# Patient Record
Sex: Male | Born: 1995 | Race: Black or African American | Hispanic: No | Marital: Single | Smoking: Never smoker
Health system: Southern US, Academic
[De-identification: ages and names within clinical notes are randomized; demographics above are authoritative.]

---
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
## 2017-10-31 IMAGING — MR MR SHOULDER*L* W/ CM
4 of 6 series · 10 of 40 positions shown · IV contrast (agent unspecified)
Comparison: Injection images same date.

CLINICAL DATA: History of left shoulder dislocation with labral
repair 14 months ago. Fall ball injury last month with recurrent
shoulder subluxation.

EXAM:
MR ARTHROGRAM OF THE LEFT SHOULDER
TECHNIQUE: Multiplanar, multisequence MR imaging of the left shoulder was
performed following the administration of intra-articular contrast.
CONTRAST:  See Injection Documentation.

[Series 9: T1 fat-sat · oblique · left · 3.0mm · 0.18mm/px · 3 of 21 slices shown (1 of 2)]
[im 4/21]
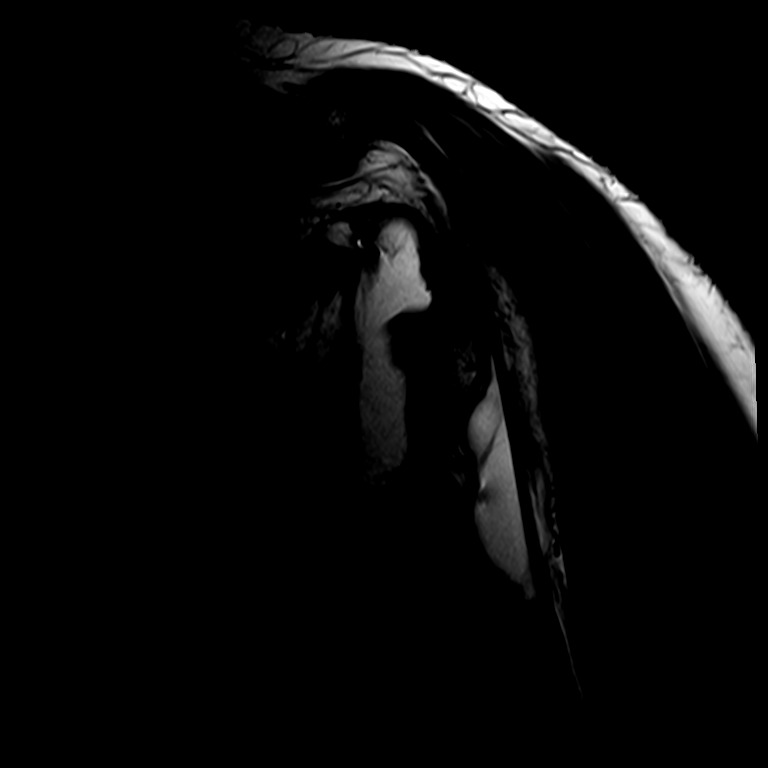
[im 11/21]
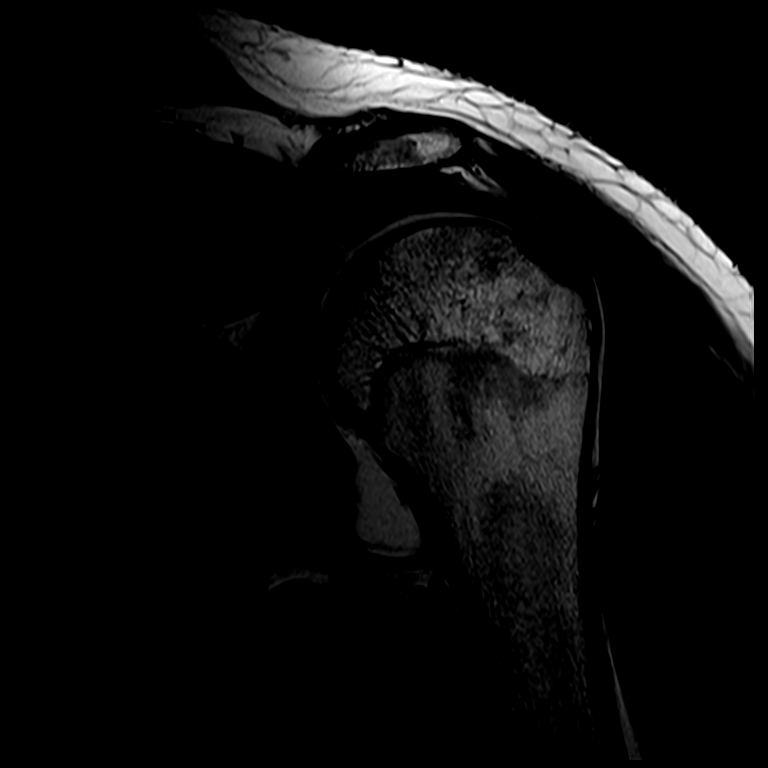
[im 17/21]
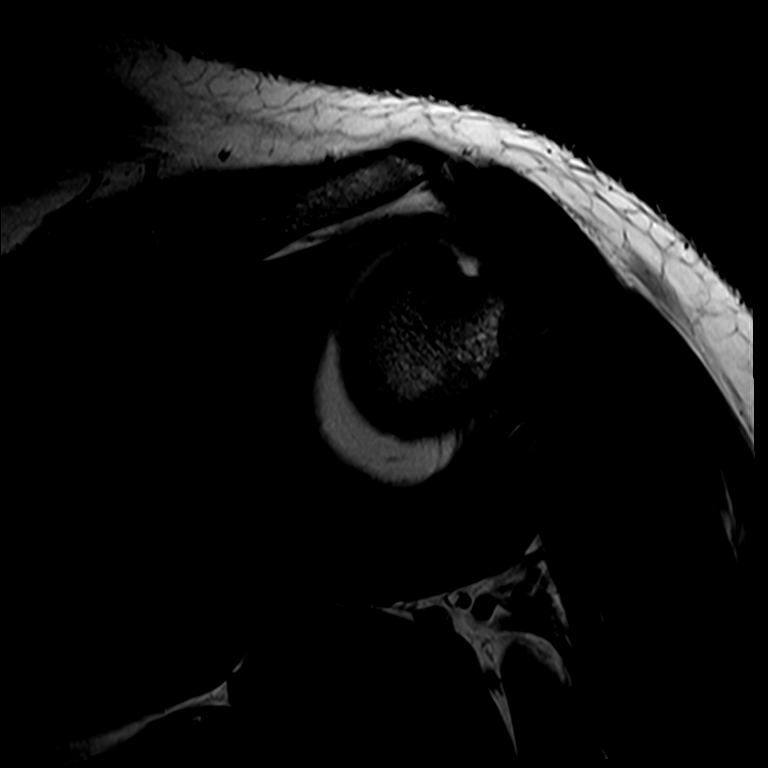

[Series 10: T2 fat-sat · oblique · left · 3.0mm · 0.22mm/px · 3 of 25 slices shown (1 of 2)]
[im 4/25]
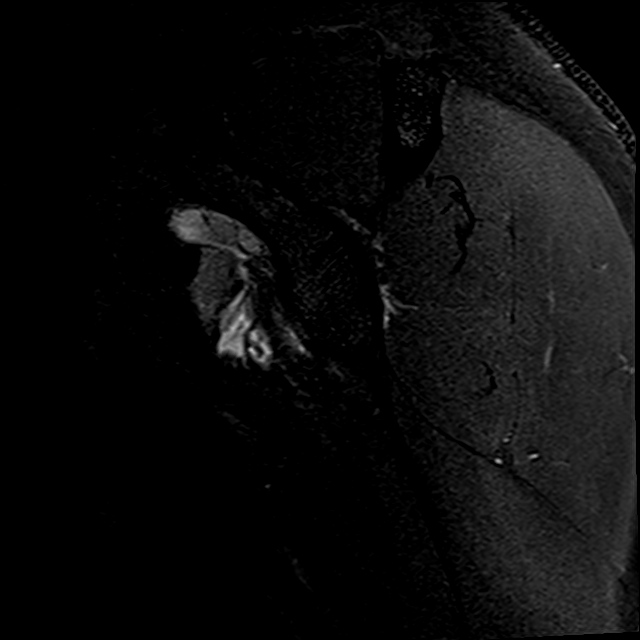
[im 14/25]
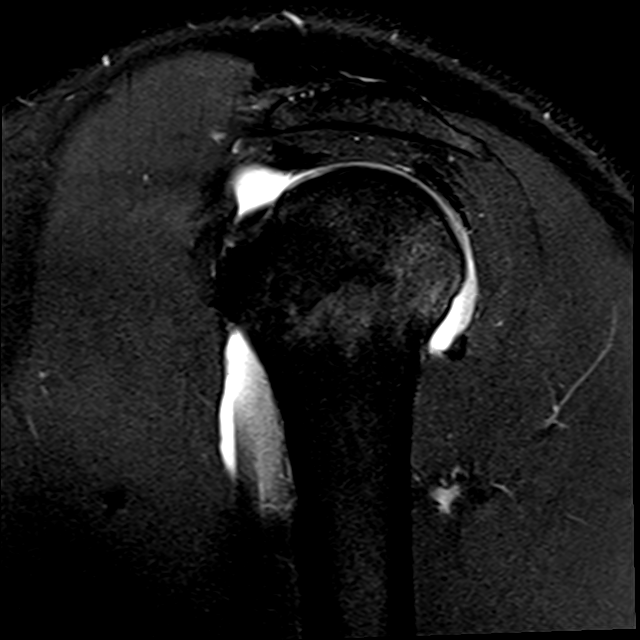
[im 21/25]
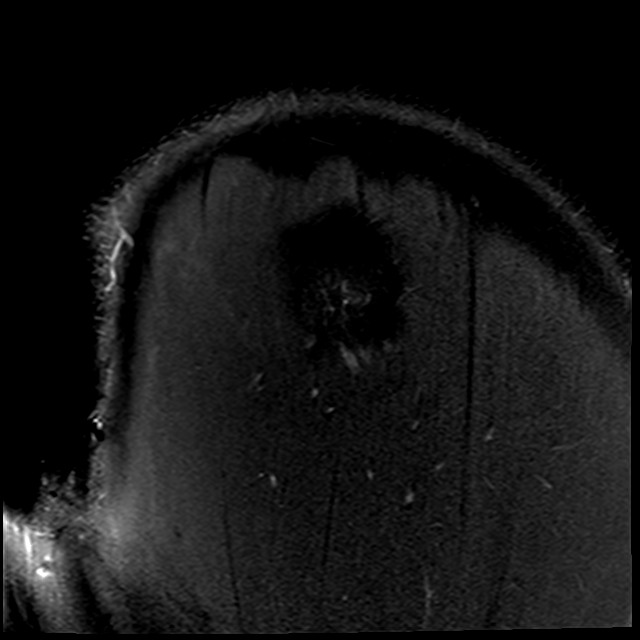

[Series 11: T1 fat-sat · oblique · left · 3.0mm · 0.18mm/px · 1 of 21 slices shown (2 of 2)]
[im 5/21]
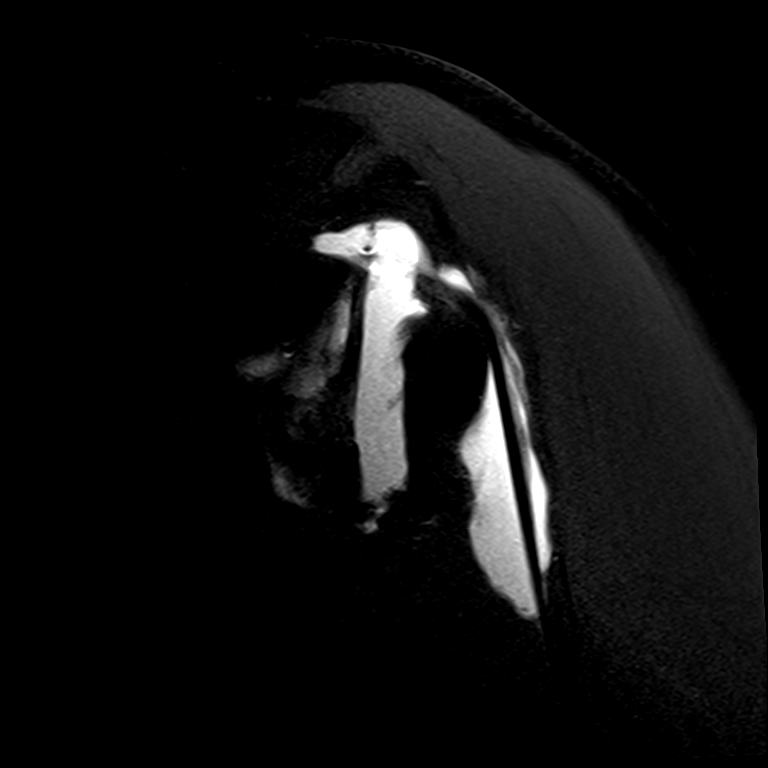

[Series 26: T2 fat-sat · oblique · left · 3.0mm · 0.22mm/px · 3 of 21 slices shown (2 of 2)]
[im 5/21]
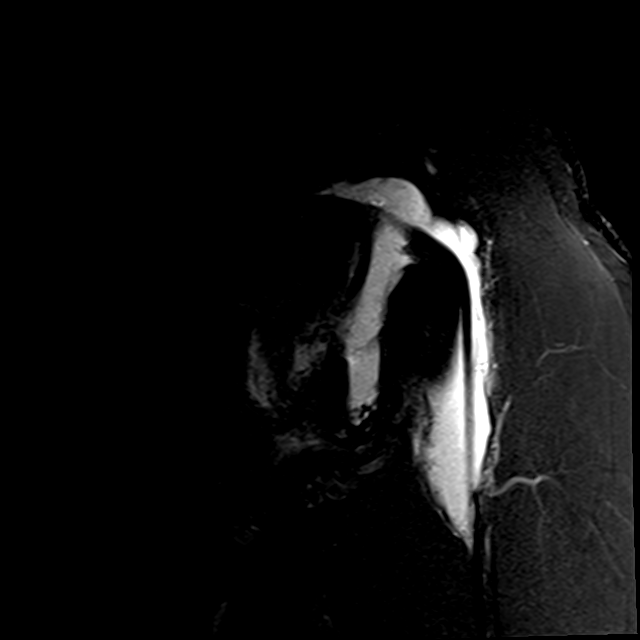
[im 13/21]
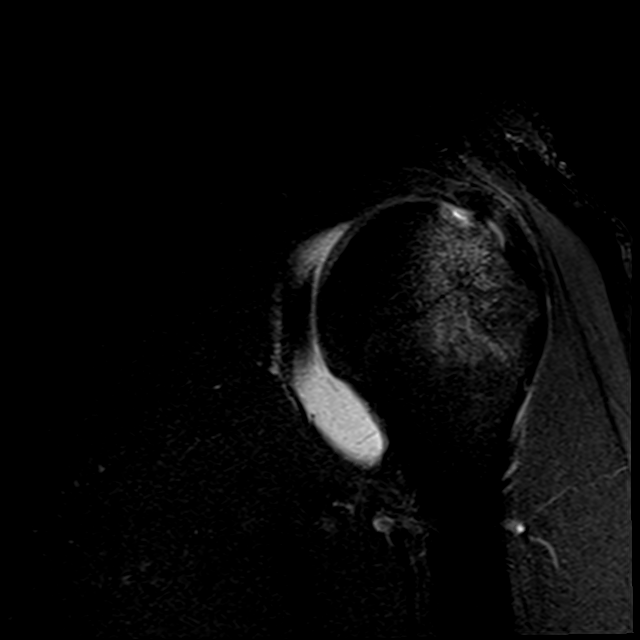
[im 21/21]
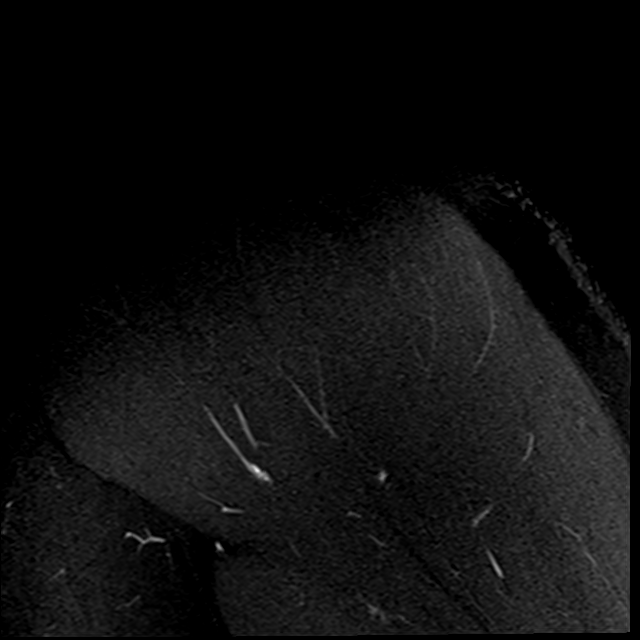

[10 of 40 positions shown; findings below may reference images not displayed]

FINDINGS: Rotator cuff: The rotator cuff is intact. There is a possible
contusion of the distal infraspinatus tendon adjacent to a
Hill-Sachs deformity of the humeral head.

Muscles:  No focal muscular atrophy or edema.

Biceps long head:  Intact and normally positioned.

Acromioclavicular Joint: The acromion is type 1. The
acromioclavicular joint demonstrates no significant abnormality. No
significant fluid is present in the subacromial - subdeltoid bursa.

Glenohumeral Joint: The shoulder joint is well distended with
contrast. There is susceptibility artifact in the joint attributed
to the patient's prior surgery. There is some synovial irregularity
suspicious for synovitis. No discrete loose bodies are observed.
There is a medial anterior capsular insertion on the scapula.The
humeral head appears mildly subluxed posteriorly relative to the
glenoid on the axial images.

Labrum: There are 4 anchor screws within the glenoid consistent with
previous labral repair. There is a large recurrent anterior inferior
labroligamentous injury. The anterior labrum becomes moderately
displaced on the ABER images. There is suspicion of an associated
osseous Labelle Salha injury, best seen on the sagittal images. The
superior labrum is intact.

Bones: As above, suspected osseous Labelle Salha injury. In addition,
there is a Hill-Sachs fracture of the humeral head with associated
marrow edema.
IMPRESSION: 1. Hill-Sachs deformity of the humeral head consistent with recent
anterior glenohumeral dislocation.
2. Associated recurrent large tear of the anterior inferior labrum,
likely an osseous Labelle Salha injury. To better assess the osseous
component, CT suggested.
3. The rotator cuff appears intact with possible contusion of the
distal infraspinatus tendon adjacent to the Hill-Sachs deformity.
4. The superior labrum and biceps tendon appear intact.

## 2017-10-31 IMAGING — XA DG FLUORO GUIDE NDL PLC/BX
3 series · 3 of 3 positions shown · non-contrast
Comparison: none

CLINICAL DATA: Previous surgery. Re-injury with pain and limited
range of motion.

[Series 1: ortho standard · 1 of 1 slices shown (1 of 3)]
[im 1/1]
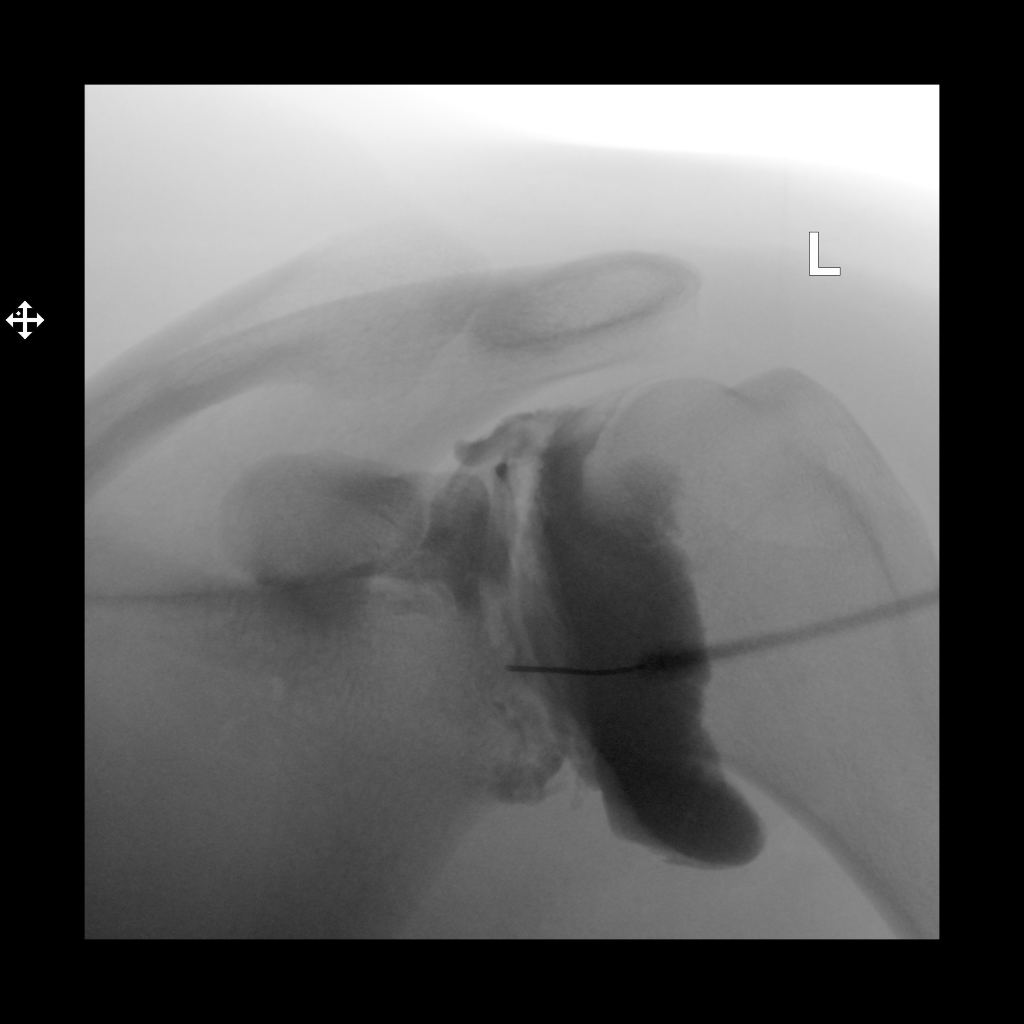

[Series 2: ortho standard · 1 of 1 slices shown (2 of 3)]
[im 1/1]
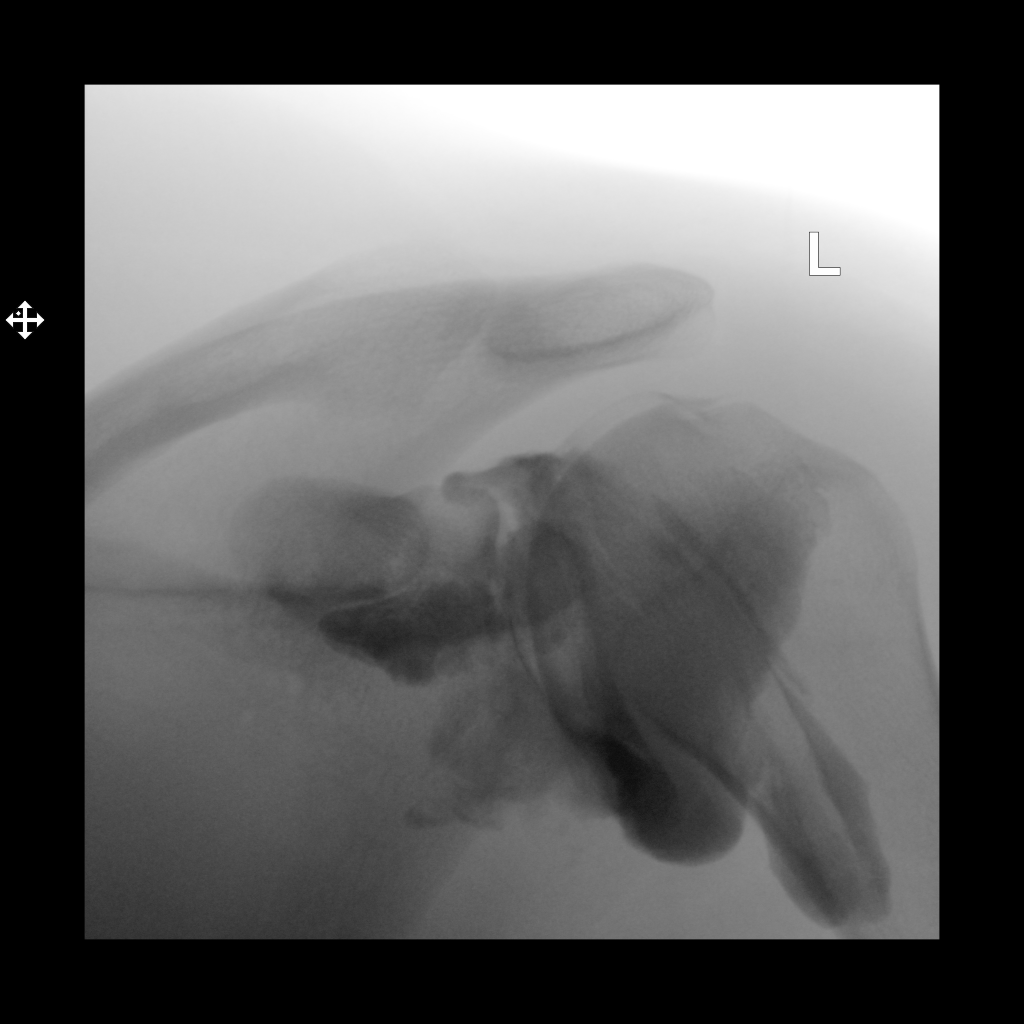

[Series 3: ortho standard · 1 of 1 slices shown (3 of 3)]
[im 1/1]
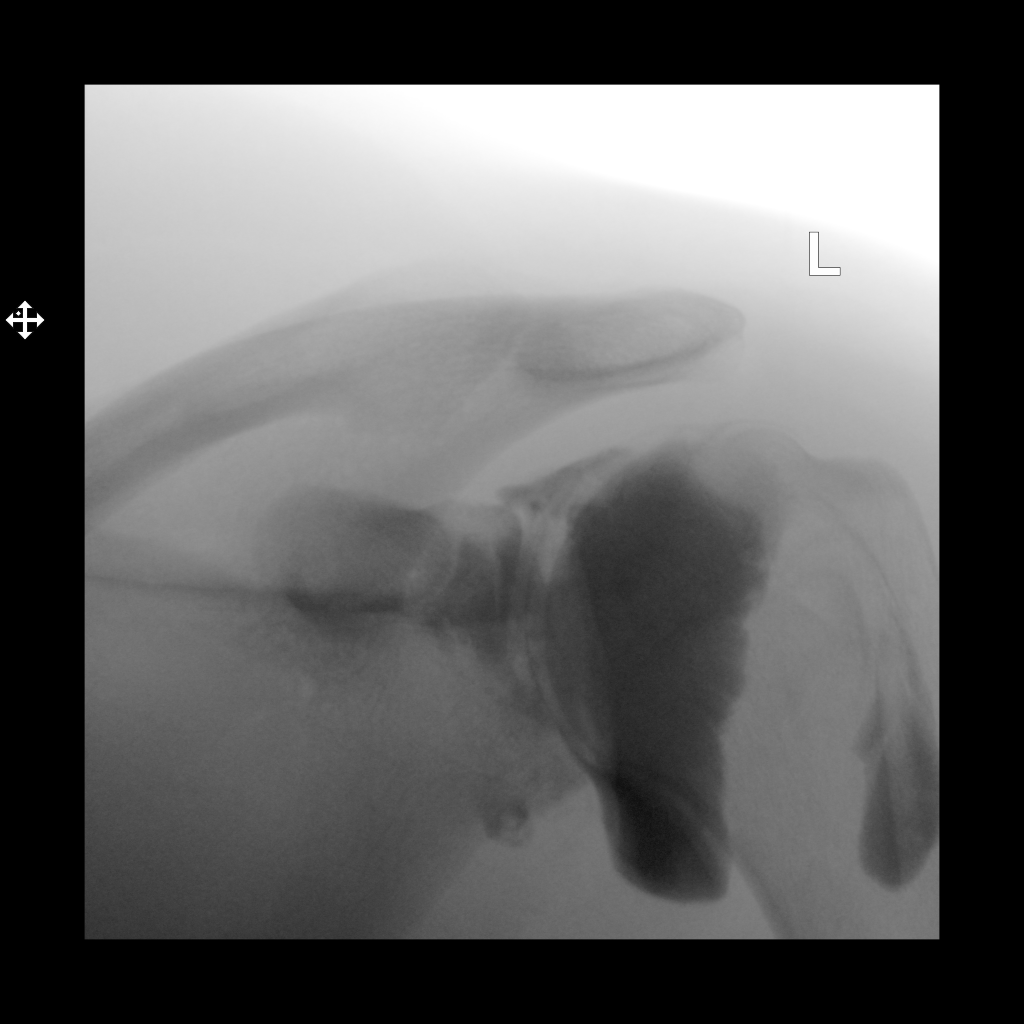

[3 of 3 positions shown; findings below may reference images not displayed]

FLUOROSCOPY TIME:  0 minutes 39 seconds. 210.07 micro gray meter
squared

PROCEDURE:
Left SHOULDER INJECTION UNDER FLUOROSCOPY

An appropriate skin entrance site was determined. The site was
marked, prepped with Betadine, draped in the usual sterile fashion,
and infiltrated locally with Lidocaine. 22 gauge spinal needle was
advanced to the glenohumeral joint under intermittent fluoroscopy. A
mixture of 0.1 ml Multihance and 20 ml of dilute Isovue 200 was then
used to fill the left glenohumeral joint. Limited filming does not
show a cuff tear.
IMPRESSION: Technically successful left shoulder injection for MRI.

## 2020-06-24 ENCOUNTER — Emergency Department
Admission: EM | Admit: 2020-06-24 | Discharge: 2020-06-24 | Disposition: A | Discharge status: Home / Self Care | Payer: 59 | Attending: Emergency Medicine | Admitting: Emergency Medicine

## 2020-06-24 ENCOUNTER — Emergency Department (HOSPITAL_COMMUNITY): Payer: 59

## 2020-06-24 ENCOUNTER — Other Ambulatory Visit: Payer: Self-pay

## 2020-06-24 DIAGNOSIS — S86811A Strain of other muscle(s) and tendon(s) at lower leg level, right leg, initial encounter: Secondary | ICD-10-CM | POA: Insufficient documentation

## 2020-06-24 DIAGNOSIS — S86819A Strain of other muscle(s) and tendon(s) at lower leg level, unspecified leg, initial encounter: Secondary | ICD-10-CM

## 2020-06-24 DIAGNOSIS — X58XXXA Exposure to other specified factors, initial encounter: Secondary | ICD-10-CM | POA: Insufficient documentation

## 2020-06-24 DIAGNOSIS — S76111A Strain of right quadriceps muscle, fascia and tendon, initial encounter: Secondary | ICD-10-CM

## 2020-06-24 DIAGNOSIS — Y9361 Activity, american tackle football: Secondary | ICD-10-CM

## 2020-06-24 MED ORDER — HYDROCODONE 5 MG-ACETAMINOPHEN 325 MG TABLET
1.0000 | ORAL_TABLET | Freq: Four times a day (QID) | ORAL | 0 refills | Status: DC | PRN
Start: 2020-06-24 — End: 2020-06-28

## 2020-06-24 MED ORDER — HYDROCODONE 5 MG-ACETAMINOPHEN 325 MG TABLET
1.0000 | ORAL_TABLET | ORAL | Status: DC | PRN
Start: 2020-06-24 — End: 2020-06-24
  Administered 2020-06-24: 1 via ORAL
  Filled 2020-06-24: qty 1

## 2020-06-24 NOTE — ED Provider Notes (Signed)
Jerome Fitzgerald   [birth year]   24 y.o.   male     Chief Complaint:   Chief Complaint   Patient presents with   . Knee Pain        HPI: This is a 24 y.o. male who presents to the emergency department complaint injury to his right knee.  He was playing football and felt a pop.  He had a prior ACL repair in the knee with a took part of the patellar tendon.  He was evaluated by the trainers and staff at the game who felt that he had a patellar tendon rupture.  He is unable to extend his leg.  He denies any numbness tingling or loss sensation no other injuries or traumas no other complaints been in good health otherwise.     Past Medical History: No past medical history on file.   Past Surgical History: No past surgical history on file.   Social History:   Social History     Tobacco Use   . Smoking status: Not on file   Substance Use Topics   . Alcohol use: Not on file   . Drug use: Not on file      Social History     Substance and Sexual Activity   Drug Use Not on file       Allergies: No Known Allergies     Review of Systems: All systems reviewed and negative except as noted in HPI.     Physical Exam:   General: Patient alert and oriented x4. No acute distress. BP (!) 149/82   Pulse 77   Temp 36.2 C (97.1 F)   Resp 17   Ht 1.803 m (5\' 11" )   Wt 122 kg (270 lb)   SpO2 96%   BMI 37.66 kg/m        HEENT: Head: Normocephalic, atraumatic. Eyes: Normal conjunctiva. Oral mucosa moist.  Respiratory: No respiratory distress noted.   MS: Appropriate ROM. Normal Strength.  Right lower extremity does show a high-riding patella.  A could not appreciate palpating the patellar tendon in the infrapatellar area.  There is significant edema at throughout the knee.  Neurovascular intact distally 2+ dorsalis pedal pulse no gross loss sensation.  Again he could not extend the leg.  He had no mediolateral joint space tenderness.  Neuro: Alert and oriented x4. No focal motor or sensory deficits. GCS 15.  Skin: Warm and dry. No  rashes or lesions appreciated.   ?   Medical Decision Making: Patient was triaged, vital signs were obtained, patient was placed in a room. I did examine the patient. After examining the patient I went ahead and did a CT scan as we do not have MRI available.  CT scan of the knee was obtained which did show a patellar tendon rupture.  I spoke with Dr. who requested an MRI be ordered and that he would see the patient in follow-up in the office.  Replacement knee immobilizer and crutches and Norco for pain.  ?   This note was partially created using voice recognition software and is inherently subject to errors including those of syntax and "sound alike " substitutions which may escape proof reading.  In such instances, original meaning may be extrapolated by contextual derivation.     ?   Clinical Impression:   Encounter Diagnosis   Name Primary?   . Patellar tendon rupture Yes      ?   Disposition: Discharged  Lanetta Inch, MD           Procedures

## 2020-06-24 NOTE — Discharge Instructions (Signed)
Rest ice elevate.  Crutches and knee immobilizer until recheck with Dr. Izola Price.  Call Dr. Izola Price office Monday morning.  Central scheduling will contact you about getting an MRI set up as soon as possible.

## 2020-06-24 NOTE — ED Nurses Note (Signed)
Patient discharged home with family.  AVS reviewed with patient/care giver.  A written copy of the AVS and discharge instructions was given to the patient/care giver.  Questions sufficiently answered as needed.  Patient/care giver encouraged to follow up with PCP as indicated.  In the event of an emergency, patient/care giver instructed to call 911 or go to the nearest emergency room.

## 2020-06-24 NOTE — ED Triage Notes (Signed)
24yr old male brought in by mother with C/O right knee pain, injured today about 1pm while playing football

## 2020-06-26 ENCOUNTER — Other Ambulatory Visit: Payer: Self-pay

## 2020-06-26 ENCOUNTER — Other Ambulatory Visit (HOSPITAL_COMMUNITY): Payer: Self-pay | Admitting: Orthopaedic Surgery

## 2020-06-26 ENCOUNTER — Ambulatory Visit
Admission: RE | Admit: 2020-06-26 | Discharge: 2020-06-26 | Disposition: A | Discharge status: Home / Self Care | Payer: 59 | Source: Ambulatory Visit | Attending: Orthopaedic Surgery | Admitting: Orthopaedic Surgery

## 2020-06-26 DIAGNOSIS — S8991XA Unspecified injury of right lower leg, initial encounter: Secondary | ICD-10-CM

## 2020-06-26 NOTE — ED Nurses Note (Signed)
Called Central Scheduling to schedule MRI per Dr Izola Price verbal order. They cannot schedule because it needs an authorization. Left message with Piedad Climes in ortho to get an [place]

## 2020-06-28 ENCOUNTER — Encounter (HOSPITAL_COMMUNITY): Payer: Self-pay | Admitting: Orthopaedic Surgery

## 2020-06-28 ENCOUNTER — Other Ambulatory Visit: Payer: Self-pay

## 2020-06-28 ENCOUNTER — Ambulatory Visit: Payer: 59 | Attending: Family Medicine | Admitting: Orthopaedic Surgery

## 2020-06-28 VITALS — Ht 71.0 in | Wt 270.0 lb

## 2020-06-28 DIAGNOSIS — W500XXA Accidental hit or strike by another person, initial encounter: Secondary | ICD-10-CM

## 2020-06-28 DIAGNOSIS — S86891A Other injury of other muscle(s) and tendon(s) at lower leg level, right leg, initial encounter: Secondary | ICD-10-CM

## 2020-06-28 DIAGNOSIS — Y9361 Activity, american tackle football: Secondary | ICD-10-CM

## 2020-06-28 DIAGNOSIS — S8991XA Unspecified injury of right lower leg, initial encounter: Secondary | ICD-10-CM

## 2020-06-28 NOTE — Nursing Note (Signed)
Patient is here for evaluation of right knee pain.  He sustained an injury 06/24/2020 while playing football.  He went to [facility] ER and had a CT and MRI done.  He has had aprior acl reconstruction on this knee.

## 2020-06-28 NOTE — Progress Notes (Signed)
Dr. Veverly Fells. Katherene Ponto SPORTS MEDICINE FELLOW   [facility]  [address]  [name] [address]        Date of birth: 25-Apr-1996  Jerome Fitzgerald 24 y.o. male  BMI: Body mass index is 37.66 kg/m.    Reason for Visit:    Chief Complaint   Patient presents with    Knee Pain     right knee pain     PCP/Requesting MD: Nelia Shi, MD      SUBJECTIVE  History of Present Illness:  Diontay Rosencrans is a 24 y.o. male who  was injured at a [place] in football game as he has a full back/running back for [employer].  He states he was running the ball got hit awkwardly his knee felt like it pivoted/dislocated his patella he had immediate pain.  He was able to bear weight but he had immediate discomfort and pain to the knee.  The orthopedic surgeon at [facility] and evaluated state he likely had a patella tendon rupture.  He came to our emergency room for evaluation treatment recommendations he had an MRI scan of the knee he comes in today for evaluation treatment orthopedically.    CT scan was done MRI scan was done both show his complication from his injury.  Patient has a prior ACL reconstruction in 2014 on the right knee.        Nursing Notes:   Almon Hercules, [place]  06/28/20 1504  Signed  Patient is here for evaluation of right knee pain.  He sustained an injury 06/24/2020 while playing football.  He went to [facility] ER and had a CT and MRI done.  He has had aprior acl reconstruction on this knee.      There are no problems to display for this patient.    History reviewed. No pertinent past medical history.  Past Surgical History:   Procedure Laterality Date    FIBULA FRACTURE SURGERY Left     HX ACL RECONSTRUCTION Right     2014    HX SHOULDER ARTHROSCOPY Left     2016     Current Outpatient Medications   Medication Sig    CONTRAVE 8-90 mg Oral Tablet Sustained Release     traMADoL (ULTRAM) 50 mg Oral Tablet      No Known Allergies  Family Medical History:     Problem Relation (Age  of Onset)    Hypertension (High Blood Pressure) Mother, Father             Social History     Socioeconomic History    Marital status: Single     Spouse name: Not on file    Number of children: Not on file    Years of education: Not on file    Highest education level: Not on file   Tobacco Use    Smoking status: Never Smoker    Smokeless tobacco: Never Used     Social Determinants of Health     Financial Resource Strain:     Difficulty of Paying Living Expenses:    Food Insecurity:     Worried About Programme researcher, broadcasting/film/video in the Last Year:     Barista in the Last Year:    Transportation Needs:     Freight forwarder (Medical):     Lack of Transportation (Non-Medical):    Physical Activity:     Days of Exercise per Week:  Minutes of Exercise per Session:    Stress:     Feeling of Stress :    Intimate Partner Violence:     Fear of Current or Ex-Partner:     Emotionally Abused:     Physically Abused:     Sexually Abused:          ROS: Review of Systems     Review of Systems:  Musculoskeletal: swelling and injury, positive for activity changes, joint pain, subtle strength changes  Constitutional: Negative for fever and chills.  In no acute distress.  HEENT: Negative.   Respiratory: Negative for shortness of breath.  Cardiovascular: Negative. walked into clinic NO SOB  Integument: Skin warm and dry.  Hematologic: Within normal limits.  Neurological: Within normal limits.  Allergic/Immunologic: Negative.    Psych: Alert and Oriented x3.      All other systems reviewed had no pertinent positives.  OBJECTIVE  Ht 1.803 m (5\' 11" )    Wt 122 kg (270 lb)    BMI 37.66 kg/m           @VITALS @     Radiology:  No orders to display      EXAM:  Constitutional  General appearance comfortable, nutritional status does not appear malnourished,     Psychiatric  Does not appear depressed or anxious currently,  alert and oriented    Skin  Normal color no lesions noted, no grossly elevated temperature or  significant texture changes    Lymph  No significant lymphadenopathy noted in the specific muscle groups and compartments tested today.    HEENT  Normocephalic atraumatic, extraocular muscles intact, anicteric, no gross lid ptosis, trachea midline    Neck/Neuro  No asymmetry no significant decrease or limitation of motion,     Respiratory  No use of accessory muscles for respiration no gross audible wheezing or labored breathing    Cardiovascular  Pulses regular 2+ radial or dorsalis pulses bilaterally, no palpable thrills    Musculoskeletal examination    Inspection large effusion of the right knee prior incision from his arthroscopy and his ACL reconstruction    Palpation pain associated with quad pain associated the lateral patella pain associated with the patella tendon on the right knee    Neuro EHL gastrocsoleus intact right lower extremity 2+ dorsalis pedis pulse noted    ROM patient has a lot of difficulty doing straight leg raise is in a current knee immobilizer    STABILITY gross patella instability    SPECIAL TESTS varus valgus stress is intact negative Lachman's    Radiology (XRAYS/MRI/EMG):        Direct visualization and interpretations of MRI ordered 9/27 and reviewed 06/28/2020 performed at  [facility] demonstrate right knee patella tendon avulsion looks to be somewhat mid substance although there is some tendon attached to the patella majority is attached the tibial tubercle prior ACL is noted.  ACL is intact, MCL is intact he has medial patellofemoral ligament laxity/capsular injury associated with the patella disruption.    MRI IMPRESSION:  1.Acute, retracted patellar tendon rupture involving the proximal  patellar tendon.   2.Low-grade strain of the intact distal quadriceps.  3.Suspected capsular injury and tearing at the lower margins of the  lateral patellofemoral ligament.  4.Intact ACL reconstruction graft, PCL, and collateral ligaments.  5.Suspected prior debridement of the lateral meniscus, as  described.  The medial meniscus is intact.  6.Large heterogeneous joint effusion likely representing a  hemarthrosis.    FINDINGS CONSISTENT WITH PATELLAR  TENDON RUPTURE WITH SIGNIFICANT ADJACENT  SOFT TISSUE EDEMA/FLUID.    SMALL JOINT EFFUSION.    INCIDENTAL FINDINGS AS DESCRIBED    Assessment:    ICD-10-CM    1. Right knee injury, initial encounter  S89.91XA    2. Patellar tendon avulsion, right, initial encounter  S86.891A      Keijuan was seen today for knee pain.    Diagnoses and all orders for this visit:    Right knee injury, initial encounter    Patellar tendon avulsion, right, initial encounter           Plan:  I discussed with the patient he is going to have to undergo a surgery to reconstruct his patellar tendon.  We will likely do drill holes through his patella and use a hamstring autograft as an augment to try to provide more collagen scaffold to heal.    I went over the surgery with him as well as his mother went over the risks and benefits the surgery which include infection DVT re-tear.    I told him likely have residual stiffness status post the procedure is the best option I have to try to improve his long-term outcome.    He states his senior year of college I told him unlikely he will play football again competitively.  He understands this and wishes to proceed with surgical intervention.  We will schedule in the near future.    Julianne Handler, DO    Portions of this note may be dictated using voice recognition software or a dictation service. Variance in spelling and vocabulary are possible and unintentional. Not all errors are caught/corrected. Please notify the Thereasa Parkin if any discrepancies are noted or if the meaning of a statement is not clear.

## 2020-06-30 ENCOUNTER — Encounter (HOSPITAL_COMMUNITY): Payer: Self-pay | Admitting: Orthopaedic Surgery

## 2020-07-04 ENCOUNTER — Inpatient Hospital Stay
Admission: RE | Admit: 2020-07-04 | Discharge: 2020-07-04 | Disposition: A | Discharge status: Home / Self Care | Payer: 59 | Source: Ambulatory Visit | Attending: Orthopaedic Surgery | Admitting: Orthopaedic Surgery

## 2020-07-04 ENCOUNTER — Encounter (HOSPITAL_COMMUNITY): Payer: Self-pay | Admitting: Orthopaedic Surgery

## 2020-07-04 ENCOUNTER — Ambulatory Visit (HOSPITAL_COMMUNITY): Payer: 59

## 2020-07-04 ENCOUNTER — Other Ambulatory Visit: Payer: Self-pay

## 2020-07-04 ENCOUNTER — Ambulatory Visit (HOSPITAL_BASED_OUTPATIENT_CLINIC_OR_DEPARTMENT_OTHER): Payer: 59 | Admitting: Anesthesiology

## 2020-07-04 ENCOUNTER — Ambulatory Visit (HOSPITAL_COMMUNITY): Payer: 59 | Admitting: Certified Registered"

## 2020-07-04 ENCOUNTER — Encounter (HOSPITAL_COMMUNITY)
Admission: RE | Disposition: A | Discharge status: Home / Self Care | Payer: Self-pay | Source: Ambulatory Visit | Attending: Orthopaedic Surgery

## 2020-07-04 ENCOUNTER — Ambulatory Visit (HOSPITAL_BASED_OUTPATIENT_CLINIC_OR_DEPARTMENT_OTHER): Payer: 59 | Admitting: Certified Registered"

## 2020-07-04 ENCOUNTER — Ambulatory Visit (HOSPITAL_COMMUNITY): Payer: 59 | Admitting: Anesthesiology

## 2020-07-04 DIAGNOSIS — S76111A Strain of right quadriceps muscle, fascia and tendon, initial encounter: Secondary | ICD-10-CM | POA: Insufficient documentation

## 2020-07-04 DIAGNOSIS — M25561 Pain in right knee: Secondary | ICD-10-CM

## 2020-07-04 DIAGNOSIS — S86811A Strain of other muscle(s) and tendon(s) at lower leg level, right leg, initial encounter: Secondary | ICD-10-CM

## 2020-07-04 DIAGNOSIS — G8918 Other acute postprocedural pain: Secondary | ICD-10-CM

## 2020-07-04 HISTORY — PX: PATELLAR TENDON REPAIR: SHX737

## 2020-07-04 SURGERY — REPAIR TENDON PATELLAR
Anesthesia: General | Laterality: Right | Wound class: Clean Wound: Uninfected operative wounds in which no inflammation occurred

## 2020-07-04 MED ORDER — DEXMEDETOMIDINE 100 MCG/ML INTRAVENOUS SOLUTION
Freq: Once | INTRAVENOUS | Status: DC | PRN
Start: 2020-07-04 — End: 2020-07-04
  Administered 2020-07-04: 20 ug via INTRAVENOUS
  Administered 2020-07-04 (×2): 10 ug via INTRAVENOUS

## 2020-07-04 MED ORDER — HYDROMORPHONE (PF) 0.5 MG/0.5 ML INJECTION SYRINGE
0.5000 mg | INJECTION | INTRAMUSCULAR | Status: AC | PRN
Start: 2020-07-04 — End: 2020-07-04
  Administered 2020-07-04 (×4): 0.5 mg via INTRAVENOUS

## 2020-07-04 MED ORDER — HYDROMORPHONE (PF) 0.5 MG/0.5 ML INJECTION SYRINGE
INJECTION | INTRAMUSCULAR | Status: AC
Start: 2020-07-04 — End: 2020-07-04
  Filled 2020-07-04: qty 0.5

## 2020-07-04 MED ORDER — ETHYL ALCOHOL 62 % (NOZIN NASAL SANITIZER) NASAL SOLUTION - BULK BOTTLE
3.0000 | Freq: Once | NASAL | Status: AC
Start: 2020-07-04 — End: 2020-07-04
  Administered 2020-07-04: 3 via NASAL

## 2020-07-04 MED ORDER — PROCHLORPERAZINE EDISYLATE 10 MG/2 ML (5 MG/ML) INJECTION SOLUTION
5.0000 mg | Freq: Once | INTRAMUSCULAR | Status: DC | PRN
Start: 2020-07-04 — End: 2020-07-04

## 2020-07-04 MED ORDER — BUPIVACAINE-EPINEPHRINE (PF) 0.5 %-1:200,000 INJECTION SOLUTION
20.0000 mL | Freq: Once | INTRAMUSCULAR | Status: AC
Start: 2020-07-04 — End: 2020-07-04
  Administered 2020-07-04: 20 mL via INTRAMUSCULAR

## 2020-07-04 MED ORDER — KETOROLAC 30 MG/ML (1 ML) INJECTION SOLUTION
Freq: Once | INTRAMUSCULAR | Status: DC | PRN
Start: 2020-07-04 — End: 2020-07-04
  Administered 2020-07-04: 30 mg via INTRAVENOUS

## 2020-07-04 MED ORDER — LACTATED RINGERS INTRAVENOUS SOLUTION
INTRAVENOUS | Status: DC
Start: 2020-07-04 — End: 2020-07-04

## 2020-07-04 MED ORDER — OXYCODONE-ACETAMINOPHEN 10 MG-325 MG TABLET
1.0000 | ORAL_TABLET | Freq: Four times a day (QID) | ORAL | 0 refills | Status: DC | PRN
Start: 2020-07-04 — End: 2020-07-17

## 2020-07-04 MED ORDER — VANCOMYCIN 1,000 MG INTRAVENOUS INJECTION
Freq: Once | INTRAVENOUS | Status: DC | PRN
Start: 2020-07-04 — End: 2020-07-04
  Administered 2020-07-04: 100 mL

## 2020-07-04 MED ORDER — ASPIRIN 81 MG CHEWABLE TABLET
81.0000 mg | CHEWABLE_TABLET | Freq: Every day | ORAL | 0 refills | Status: DC
Start: 2020-07-04 — End: 2020-08-14

## 2020-07-04 MED ORDER — ONDANSETRON HCL (PF) 4 MG/2 ML INJECTION SOLUTION
Freq: Once | INTRAMUSCULAR | Status: DC | PRN
Start: 2020-07-04 — End: 2020-07-04
  Administered 2020-07-04: 4 mg via INTRAVENOUS

## 2020-07-04 MED ORDER — SODIUM CHLORIDE 0.9 % (FLUSH) INJECTION SYRINGE
3.0000 mL | INJECTION | INTRAMUSCULAR | Status: DC | PRN
Start: 2020-07-04 — End: 2020-07-04

## 2020-07-04 MED ORDER — ALBUTEROL SULFATE CONCENTRATE 2.5 MG/0.5 ML SOLUTION FOR NEBULIZATION
2.5000 mg | INHALATION_SOLUTION | Freq: Once | RESPIRATORY_TRACT | Status: DC | PRN
Start: 2020-07-04 — End: 2020-07-04

## 2020-07-04 MED ORDER — FENTANYL (PF) 50 MCG/ML INJECTION SOLUTION
Freq: Once | INTRAMUSCULAR | Status: DC | PRN
Start: 2020-07-04 — End: 2020-07-04
  Administered 2020-07-04 (×12): 50 ug via INTRAVENOUS
  Administered 2020-07-04: 100 ug via INTRAVENOUS

## 2020-07-04 MED ORDER — CLINDAMYCIN HCL 150 MG CAPSULE
150.0000 mg | ORAL_CAPSULE | Freq: Three times a day (TID) | ORAL | 0 refills | Status: AC
Start: 2020-07-04 — End: 2020-07-11

## 2020-07-04 MED ORDER — MIDAZOLAM 1 MG/ML INJECTION SOLUTION
Freq: Once | INTRAMUSCULAR | Status: DC | PRN
Start: 2020-07-04 — End: 2020-07-04
  Administered 2020-07-04 (×2): 2 mg via INTRAVENOUS

## 2020-07-04 MED ORDER — IPRATROPIUM 0.5 MG-ALBUTEROL 3 MG (2.5 MG BASE)/3 ML NEBULIZATION SOLN
3.0000 mL | INHALATION_SOLUTION | Freq: Once | RESPIRATORY_TRACT | Status: DC | PRN
Start: 2020-07-04 — End: 2020-07-04

## 2020-07-04 MED ORDER — PROPOFOL 10 MG/ML IV BOLUS
INJECTION | Freq: Once | INTRAVENOUS | Status: DC | PRN
Start: 2020-07-04 — End: 2020-07-04
  Administered 2020-07-04: 300 mg via INTRAVENOUS

## 2020-07-04 MED ORDER — SODIUM CHLORIDE 0.9 % (FLUSH) INJECTION SYRINGE
3.0000 mL | INJECTION | Freq: Three times a day (TID) | INTRAMUSCULAR | Status: DC
Start: 2020-07-04 — End: 2020-07-04

## 2020-07-04 MED ORDER — BUPIVACAINE (PF) 0.5 % (5 MG/ML) INJECTION SOLUTION
Freq: Once | INTRAMUSCULAR | Status: AC | PRN
Start: 2020-07-04 — End: 2020-07-04
  Administered 2020-07-04: 30 mL

## 2020-07-04 MED ORDER — HYDROMORPHONE 1 MG/ML INJECTION WRAPPER
INJECTION | Freq: Once | INTRAMUSCULAR | Status: DC | PRN
Start: 2020-07-04 — End: 2020-07-04
  Administered 2020-07-04 (×3): 1 mg via INTRAVENOUS

## 2020-07-04 MED ORDER — ACETAMINOPHEN 1,000 MG/100 ML (10 MG/ML) INTRAVENOUS SOLUTION
Freq: Once | INTRAVENOUS | Status: DC | PRN
Start: 2020-07-04 — End: 2020-07-04
  Administered 2020-07-04: 1000 mg via INTRAVENOUS

## 2020-07-04 MED ORDER — DEXAMETHASONE SODIUM PHOSPHATE (PF) 10 MG/ML INJECTION SOLUTION
Freq: Once | INTRAMUSCULAR | Status: DC | PRN
Start: 2020-07-04 — End: 2020-07-04
  Administered 2020-07-04: 10 mg

## 2020-07-04 MED ORDER — ONDANSETRON HCL (PF) 4 MG/2 ML INJECTION SOLUTION
4.0000 mg | Freq: Once | INTRAMUSCULAR | Status: DC | PRN
Start: 2020-07-04 — End: 2020-07-04

## 2020-07-04 MED ORDER — TRANEXAMIC ACID 1,000 MG/10 ML (100 MG/ML) INTRAVENOUS SOLUTION
1000.0000 mg | Freq: Once | INTRAVENOUS | Status: AC
Start: 2020-07-04 — End: 2020-07-04
  Administered 2020-07-04: 09:00:00 1000 mg via INTRAVENOUS
  Filled 2020-07-04: qty 10

## 2020-07-04 MED ORDER — HYDROMORPHONE (PF) 0.5 MG/0.5 ML INJECTION SYRINGE
0.2500 mg | INJECTION | INTRAMUSCULAR | Status: DC | PRN
Start: 2020-07-04 — End: 2020-07-04

## 2020-07-04 MED ORDER — SODIUM CHLORIDE 0.9 % INTRAVENOUS SOLUTION
3.0000 g | Freq: Once | INTRAVENOUS | Status: AC
Start: 2020-07-04 — End: 2020-07-04
  Administered 2020-07-04: 09:00:00 3 g via INTRAVENOUS
  Filled 2020-07-04: qty 30

## 2020-07-04 SURGICAL SUPPLY — 66 items
ADHESIVE TISSUE EXOFIN .5ML PREMIERPRO EXOFIN MICRO HV (SEALANTS) ×1
APPL 70% ISPRP 2% CHG 26ML 13._2X13.2IN CHLRPRP PREP DEHP-FR (WOUND CARE/ENTEROSTOMAL SUPPLY) ×2
APPL 70% ISPRP 2% CHG 26ML CHLRPRP HI-LT ORNG PREP STRL LF  DISP CLR (WOUND CARE SUPPLY) ×2 IMPLANT
ARMBOARD IV FM PSTNR (POSITIONING PRODUCTS) ×1
ARMBRD POSITION 20X8X2IN DVN FOAM (POSITIONING PRODUCTS) ×1 IMPLANT
BANDAGE ESMARK 12FTX6IN ELAS P LSTR RYN COMP BLU STRL LF (WOUND CARE/ENTEROSTOMAL SUPPLY) ×1
BANDAGE ESMARK 12FTX6IN ELAS P_LSTR RYN COMP BLU STRL LF (WOUND CARE SUPPLY) ×1 IMPLANT
BANDAGE FLX-MSTR 10YDX6IN ELAS HVDTY PREM CLIP CLSR POLY COT (WOUND CARE SUPPLY) ×1 IMPLANT
BANDAGE FLX-MSTR 10YDX6IN ELAS HVDTY PREM CLIP CLSR POLY COT (WOUND CARE/ENTEROSTOMAL SUPPLY) ×1
BLADE SURG CLPR NRW 25X.23MM TAPER HEAD SENSICLIP LF  DISP PNK (SURGICAL CUTTING SUPPLIES) ×1 IMPLANT
CAN SUCT 1200CC 90 DEG ADPR LOCK LID OVFLW SHTOF VALVE MEDIVAC GRDN DISP (Suction) ×1 IMPLANT
CONV USE 131090 - NEEDLE HYPO  21GA 1.5IN MONOJECT MAGELLAN SS BVL ORT SLF LEVEL SHEATH SFSHLD STD LL SYRG GRN STRL LF (NEEDLES & SYRINGE SUPPLIES) ×1 IMPLANT
COUNTER SPONGE BAG PCKT NONST LF  4X4IN CLR PE (MISCELLANEOUS PT CARE ITEMS) ×1 IMPLANT
COUNTER SPONGE BAG PCKT NONST LF 4X4IN CLR PE (MISCELLANEOUS PT CARE ITEMS) ×1
COVER SHOE HGRD SMS PLASTIC FO_AM UNIV ULTRA FULL CVRGLS (SHVS) ×1 IMPLANT
DEVICE SUT STRATAFIX SPRL PDO_36CM ABS KNTLS TISS CONTROL 2 (SUTURE/WOUND CLOSURE) ×1
DISC USE ITEM 163322 - SYRINGE LL 20ML LTX STRL MED (NEEDLES & SYRINGE SUPPLIES) ×1 IMPLANT
DISCONTINUED USE 330076 - ADH SKNCLS EXOFIN PREMIERPRO MICRO HVSC SFT FLXB APPL TUBE STRL TISS LF  DISP .5ML (SEALANTS) ×1 IMPLANT
DISCONTINUED USE 337914 - PACK SURG TL JNT DISP (CUSTOM TRAYS & PACK) ×1 IMPLANT
DRAIN WOUND 12IN 3/8IN PNRS 70 STD (Drains/Resovoirs) ×1 IMPLANT
DRAPE 2 INCS FILM ANTIMIC 23X17IN IOBN STRL SURG (PROTECTIVE PRODUCTS/GARMENTS) ×1 IMPLANT
DRAPE ADH 51X47IN STRDRP LF  STRL DISP SURG CLR (PROTECTIVE PRODUCTS/GARMENTS) ×1 IMPLANT
DRAPE ADH 51X47IN U STRDRP LF_STRL DISP SURG PLASTIC CLR (PROTECTIVE PRODUCTS/GARMENTS) ×1
DRESS 10X4IN ADH ANTIMIC BARRIER PAD POSTOP IONIC SILVER SIL STRL LF  OPTFM GNTL AG+ FOAM (WOUND CARE SUPPLY) ×1 IMPLANT
ELECTRODE ESURG BLADE 6.5IN 3/32IN VLAB STRL SS 1IN DISP STD SHAFT XTD LF (CAUTERY SUPPLIES) ×1 IMPLANT
ELECTRODE ESURG XTD BLADE 6.5I N 3/32IN VLAB STRL SS DISP STD (CAUTERY SUPPLIES) ×1
ELECTRODE PATIENT RTN 9FT VLAB C30- LB RM PHSV ACRL FOAM CORD NONIRRITATE NONSENSITIZE ADH STRP (CAUTERY SUPPLIES) ×1 IMPLANT
FIBERWIRE 5/7 ×8 IMPLANT
KIT INSTR ACL TRNTB DISP (ORTHOPEDICS (NOT IMPLANTS)) ×1 IMPLANT
KNEE PAD THRP PUMP HIFLO RATE XL PLRCR CUBE REUSE C THRP SYS (MISCELLANEOUS PT CARE ITEMS) ×1 IMPLANT
NEEDLE SUT T-5 .5 CRC TAPER 26.5MM LOOP BIOTENOD NITINOL DISP (NEEDLES & SYRINGE SUPPLIES) ×2 IMPLANT
PACK SURG TL JNT DISP (CUSTOM TRAYS & PACK) ×1
PASSER SUTLASSO 90D STR CURVE REELPASS SUT (SUTURE/WOUND CLOSURE) ×1 IMPLANT
PASSER SUTLASSO 90D STR CURVE_REELPASS SUT (SUTURE/WOUND CLOSURE) ×1
RETRIEVER SUT 10.1IN HWSN DRIL L GUIDE LGMNT STRL LF (INSTRUMENTS ENDOMECHANICAL) ×1
RETRIEVER SUT 10.1IN HWSN LGMNT DRILL GUIDE STRL LF (ENDOSCOPIC SUPPLIES) ×1 IMPLANT
SET HANDPC INTPLS COAX ML ORFC_TIP SUCT TUBE STRL LF DISP (MISCELLANEOUS PT CARE ITEMS) ×1
SET INTPLS COAX ML ORFC TIP SUCT TUBE HANDPC STRL LF  DISP (MISCELLANEOUS PT CARE ITEMS) ×1 IMPLANT
SOL IRRG 0.9% NACL 1000ML PLASTIC PR BTL ISTNC N-PYRG STRL LF (SOLUTIONS) ×1 IMPLANT
SOL IRRG 0.9% NACL 3L ARTHMTC LF (SOLUTIONS) ×1 IMPLANT
SOL IRRG 0.9% NACL 500ML PLASTIC PR BTL ISTNC N-PYRG STRL LF (SOLUTIONS) ×1 IMPLANT
SPONGE GAUZE NON STRL 4 X 4IN 2634 (WOUND CARE SUPPLY) ×10 IMPLANT
SPONGE GAUZE NON STRL 4 X 4IN 2634 (WOUND CARE/ENTEROSTOMAL SUPPLY) ×10
SPONGE LAP 18X18IN 6.5 PREWASH RING SNSCR PLISPRN ALOE WHT (WOUND CARE/ENTEROSTOMAL SUPPLY) ×1
SPONGE LAP 18X18IN PREWASH RING POCKIT PCH STRL LF  DISP WHT (WOUND CARE SUPPLY) ×1 IMPLANT
STAPLER SKIN 4.1X6.5MM 35 W STPL CART LF  APS U DISP CLR SS PLASTIC (ENDOSCOPIC SUPPLIES) ×1 IMPLANT
STKNT ORTHO 48X12IN COT EXTEN SIVE LN IMPRV LINR WO EZ PUL (SOFT) IMPLANT
SUTURE 0 OS-6 VICRYL+ 27IN UNDYED BRD ANBCTRL COAT ABS (SUTURE/WOUND CLOSURE) ×3 IMPLANT
SUTURE 1 CT1 VICRYL+ 27IN VIOL BRD ANBCTRL COAT ABS (SUTURE/WOUND CLOSURE) ×3 IMPLANT
SUTURE 2 7MM STR FIBERLOOP 20IN BLU BRD MS NEEDLE NONAB 76MM (SUTURE/WOUND CLOSURE) ×1 IMPLANT
SUTURE 2 C-13 FIBERWIRE 38IN BLU BRD TIE MS LWR KNT PROF NONAB (SUTURE/WOUND CLOSURE) ×2 IMPLANT
SUTURE 2 C-13 FIBERWIRE 38IN B_LU BRD TIE MS LWR KNT PRFL (SUTURE/WOUND CLOSURE) ×2
SUTURE 2 STR TIGERLOOP TIGERWI RE 20IN GRN WHT BRD MS NDL (SUTURE/WOUND CLOSURE) ×1
SUTURE 2 STR TIGERLOOP TIGERWIRE 20IN GRN WHT BRD MS NEEDLE NONAB 3IN (SUTURE/WOUND CLOSURE) ×1 IMPLANT
SUTURE 2-0 CT2 VICRYL+ 27IN UN DYED BRD COAT ABS (SUTURE/WOUND CLOSURE) ×1
SUTURE 2-0 CT2 VICRYL+ 27IN UNDYED BRD ANBCTRL COAT ABS (SUTURE/WOUND CLOSURE) ×1 IMPLANT
SUTURE 2-0 MH STRATAFIX SPRL PDO 36CM VIOL ABS KNOTLESS TISS CONTROL 2 ARM ABS BIDIR (SUTURE/WOUND CLOSURE) ×1 IMPLANT
SUTURE ANCHOR 7 X 19.1 ×1 IMPLANT
SYRINGE LL 30ML LF  STRL CONCEN TIP GRAD N-PYRG DEHP-FR MED DISP (NEEDLES & SYRINGE SUPPLIES) ×1 IMPLANT
TRAY CATH 16FR BARDEX STATLK FOLEY URMTR ADV MICROBICIDAL CONTROL FIT OUTLET TUBE BACTI-GRD NATURAL (UROLOGICAL SUPPLIES) ×1 IMPLANT
TRAY CATH 16FR BARDEX STATLK F_OLEY URMTR ADV MICROBICIDAL (UROLOGICAL SUPPLIES) ×1
TUBING DRAINAGE 12X5/8IN 3/8IN_RUB RADOPQ A PNRS FLT STRL LTX (Drains/Resovoirs) ×1
WATER STRL 1000ML PLASTIC PR BTL LF (SOLUTIONS) ×1 IMPLANT
WATER STRL 500ML PLASTIC PR BTL LF (SOLUTIONS) ×1 IMPLANT
WIPE PREP 7.5X7.5IN 2% CHG ALC FREE RINSE FREE SAGE LF (WOUND CARE SUPPLY) ×1 IMPLANT
WIPE PREP 7.5X7.5IN 2% CHG ALC FREE RINSE FREE SAGE LF (WOUND CARE/ENTEROSTOMAL SUPPLY) ×1

## 2020-07-04 NOTE — H&P (Signed)
Dr. Veverly Fells. Katherene Ponto SPORTS MEDICINE FELLOW   [facility]  [address]  [name] [address]        Date of birth: 1995/12/05  Jerome Fitzgerald 24 y.o. male  BMI: Body mass index is 37.66 kg/m.    Reason for Visit:         Chief Complaint   Patient presents with   . Knee Pain     right knee pain     PCP/Requesting MD: Nelia Shi, MD      SUBJECTIVE  History of Present Illness:  Jerome Fitzgerald is a 25 y.o. 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  He states he was running the ball got hit awkwardly his knee felt like it pivoted he thinks he may have dislocated his patella he had immediate pain.  He was able to bear weight but he had immediate discomfort and pain to the knee.  The orthopedic surgeon at [facility] and evaluated state he likely had a patella tendon rupture.  He came to our emergency room for evaluation treatment recommendations he had an MRI scan of the knee he comes in today for evaluation treatment orthopedically.    CT scan was done MRI scan was done both show his complication from his injury.  Patient has a prior ACL reconstruction in 2014 on the right knee.        Nursing Notes:   Almon Hercules, MA  1  Patient is here for evaluation of right knee pain.  He sustained an injury 06/24/2020 while playing football.  He went to [facility] ER and had a CT and MRI done.  He has had aprior acl reconstruction on this knee.      There are no problems to display for this patient.    History reviewed. No pertinent past medical history.        Past Surgical History:   Procedure Laterality Date   . FIBULA FRACTURE SURGERY Left    . HX ACL RECONSTRUCTION Right     2014   . HX SHOULDER ARTHROSCOPY Left     2016          Current Outpatient Medications   Medication Sig   . CONTRAVE 8-90 mg Oral Tablet Sustained Release    . traMADoL (ULTRAM) 50 mg Oral Tablet      No Known Allergies        Family Medical History:        Problem Relation (Age of Onset)    Hypertension (High Blood Pressure) Mother, Father                Social History           Socioeconomic History   . Marital status: Single     Spouse name: Not on file   . Number of children: Not on file   . Years of education: Not on file   . Highest education level: Not on file   Tobacco Use   . Smoking status: Never Smoker   . Smokeless tobacco: Never Used     Social Determinants of Cabin crew Strain:    . Difficulty of Paying Living Expenses:    Food Insecurity:    . Worried About Programme researcher, broadcasting/film/video in the Last Year:    . [employer]  in the Last Year:    Transportation Needs:    . Freight forwarder (Medical):    Marland Kitchen Lack of Transportation (Non-Medical):    Physical Activity:    . Days of Exercise per Week:    . Minutes of Exercise per Session:    Stress:    . Feeling of Stress :    Intimate Partner Violence:    . Fear of Current or Ex-Partner:    . Emotionally Abused:    Marland Kitchen Physically Abused:    . Sexually Abused:          ROS: Review of Systems     Review of Systems:  Musculoskeletal: swelling and injury, positive for activity changes, joint pain, subtle strength changes  Constitutional: Negative for fever and chills.  In no acute distress.  HEENT: Negative.   Respiratory: Negative for shortness of breath.  Cardiovascular: Negative. walked into clinic NO SOB  Integument: Skin warm and dry.  Hematologic: Within normal limits.  Neurological: Within normal limits.  Allergic/Immunologic: Negative.    Psych: Alert and Oriented x3.      All other systems reviewed had no pertinent positives.  OBJECTIVE  Ht 1.803 m (5\' 11" )   Wt 122 kg (270 lb)   BMI 37.66 kg/m           @VITALS @     Radiology:  No orders to display      EXAM:  Constitutional  General appearance comfortable, nutritional status does not appear malnourished,     Psychiatric  Does not appear depressed or anxious currently,  alert and  oriented    Skin  Normal color no lesions noted, no grossly elevated temperature or significant texture changes    Lymph  No significant lymphadenopathy noted in the specific muscle groups and compartments tested today.    HEENT  Normocephalic atraumatic, extraocular muscles intact, anicteric, no gross lid ptosis, trachea midline    Neck/Neuro  No asymmetry no significant decrease or limitation of motion,     Respiratory  No use of accessory muscles for respiration no gross audible wheezing or labored breathing    Cardiovascular  Pulses regular 2+ radial or dorsalis pulses bilaterally, no palpable thrills    Musculoskeletal examination    Inspection large effusion of the right knee prior incision from his arthroscopy and his ACL reconstruction    Palpation pain associated with quad pain associated the lateral patella pain associated with the patella tendon on the right knee    Neuro EHL gastrocsoleus intact right lower extremity 2+ dorsalis pedis pulse noted    ROM patient has a lot of difficulty doing straight leg raise is in a current knee immobilizer    STABILITY gross patella instability    SPECIAL TESTS varus valgus stress is intact negative Lachman's    Radiology (XRAYS/MRI/EMG):        Direct visualization and interpretations of MRI ordered 9/27 and reviewed 06/28/2020 performed at  [facility] demonstrate right knee patella tendon avulsion looks to be somewhat mid substance although there is some tendon attached to the patella majority is attached the tibial tubercle prior ACL is noted.  ACL is intact, MCL is intact he has medial patellofemoral ligament laxity/capsular injury associated with the patella disruption.    MRI IMPRESSION:  1.Acute, retracted patellar tendon rupture involving the proximal  patellar tendon.   2.Low-grade strain of the intact distal quadriceps.  3.Suspected capsular injury and tearing at the lower margins of the  lateral patellofemoral ligament.  4.Intact  ACL  reconstruction graft, PCL, and collateral ligaments.  5.Suspected prior debridement of the lateral meniscus, as described.  The medial meniscus is intact.  6.Large heterogeneous joint effusion likely representing a  hemarthrosis.    FINDINGS CONSISTENT WITH PATELLAR TENDON RUPTURE WITH SIGNIFICANT ADJACENT  SOFT TISSUE EDEMA/FLUID.    SMALL JOINT EFFUSION.    INCIDENTAL FINDINGS AS DESCRIBED    Assessment:    ICD-10-CM    1. Right knee injury, initial encounter  S89.91XA    2. Patellar tendon avulsion, right, initial encounter  S86.891A      Jerome Fitzgerald was seen today for knee pain.    Diagnoses and all orders for this visit:    Right knee injury, initial encounter    Patellar tendon avulsion, right, initial encounter           Plan:  I discussed with the patient he is going to have to undergo a surgery to reconstruct his patellar tendon.  We will likely do drill holes through his patella and use a hamstring autograft as an augment to try to provide more collagen scaffold to heal.    I went over the surgery with him as well as his mother went over the risks and benefits the surgery which include infection DVT re-tear.    I told him likely have residual stiffness status post the procedure is the best option I have to try to improve his long-term outcome.    He states his senior year of college I told him unlikely he will play football again competitively.  He understands this and wishes to proceed with surgical intervention.  We will schedule in the near future.    Julianne Handler, DO    Portions of this note may be dictated using voice recognition software or a dictation service. Variance in spelling and vocabulary are possible and unintentional. Not all errors are caught/corrected. Please notify the Thereasa Parkin if any discrepancies are noted or if the meaning of a statement is not clear.

## 2020-07-04 NOTE — Anesthesia Transfer of Care (Signed)
ANESTHESIA TRANSFER OF CARE   Jerome Fitzgerald is a 24 y.o. ,male, Weight: 122 kg (270 lb)   had Procedure(s):  RIGHT OPEN PATELLAR TENDON REPAIR  WITH HAMSTRING AUTOGRAFT AUGMENTATION  performed  07/04/20   Primary Service: Julianne Handler*    History reviewed. No pertinent past medical history.   Allergy History as of 07/04/20      No Known Allergies              I completed my transfer of care / handoff to the receiving personnel during which we discussed:  Access, Airway, All key/critical aspects of case discussed, Analgesia, Antibiotics, Expectation of post procedure, Fluids/Product, Gave opportunity for questions and acknowledgement of understanding, Labs and PMHx    Post Location: PACU                                                                  Last OR Temp: Temperature: 36.6 C (97.9 F)  ABG:   Airway:* No LDAs found *  Blood pressure (!) 151/83, pulse 82, temperature 36.6 C (97.9 F), resp. rate 16, height 1.803 m (5\' 11" ), weight 122 kg (270 lb), SpO2 98 %.

## 2020-07-04 NOTE — Anesthesia Preprocedure Evaluation (Signed)
ANESTHESIA PRE-OP EVALUATION  Planned Procedure: RIGHT OPEN PATELLAR TENDON REPAIR  WITH HAMSTRING AUTOGRAFT AUGMENTATION (Right )  Review of Systems     anesthesia history negative     patient summary reviewed  nursing notes reviewed        Pulmonary  negative pulmonary ROS,    Cardiovascular     Exercise Tolerance: > or = 4 METS        GI/Hepatic/Renal   negative GI/hepatic/renal ROS,      Endo/Other   neg endo/other ROS,        Neuro/Psych/MS   negative neuro/psych ROS,      Cancer  negative hematology/oncology ROS,                    Physical Assessment      Patient summary reviewed and Nursing notes reviewed   Airway       Mallampati: II    TM distance: >3 FB    Neck ROM: full  Mouth Opening: good.  No Facial hair  No Beard        Dental       Dentition intact             Pulmonary    Breath sounds clear to auscultation       Cardiovascular    Rhythm: regular  Rate: Normal       Other findings            Plan  ASA 2     Planned anesthesia type: regional block and general LMA              Intravenous induction     Anesthesia issues/risks discussed are: PONV, Dental Injuries, Cardiac Events/MI, Aspiration, Sore Throat and Stroke.  Anesthetic plan and risks discussed with patient.          Patient's NPO status is appropriate for Anesthesia.           Plan discussed with CRNA.

## 2020-07-04 NOTE — OR Surgeon (Signed)
Jerome Fitzgerald is a 24 y.o. male      PREOPERATIVE DIAGNOSIS:  Right knee patella tendon rupture, prior right knee bone patellar tendon bone ACL     POSTOPERATIVE DIAGNOSIS:  Right knee patellar tendon rupture, prior right knee bone patellar tendon bone ACL     PROCEDURE:  1. Open right knee patella tendon reconstruction with hamstring autograft augmentation      SURGEON:  Lilly Cove, DO    ASSISTANT:  Leda Roys    ANESTHESIA: General with adductor canal    ESTIMATED BLOOD LOSS:   100    IV FLUIDS:  see anesthesia chart    ANTIBIOTICS: 3g ancef    TOURNIQUET TIME:  75 minute  minutes    Counts: correct    COMPLICATIONS: none     IMPLANTS:  Arthrex bio interference screw distal tunnel 7 millimeters.  Total of 4 5. Fiber wires    DISPOSITON: stable to PACU   INDICATIONS:  24 year old male tore his patella tendon while playing football at [employer].  Four years ago the patient underwent a prior bone patellar tendon bone ACL reconstruction without complication.  Unfortunately during this event approximately 2 weeks ago he had a midsubstance tear of the patella tendon also tearing the medial and lateral retinaculum.  Because of the unknown from his prior patella tendon healing we elected to use a hamstring autograft as a reconstruction of the patellar tendon.      The patients preop exam and imaging study were consistent with the above noted diagnosis. I discussed in detail with the patient the nature of the procedure including risks, benefits, prognosis and alternatives. Informed consent was obtained.  Prior to surgery the appropriate site was identified by the patient and marked with my initials. A time out was taken to verify the correct patient, the correct site and the correct procedure.    EXAM UNDER ANESTHESIA: There was an abnormal Lachman.  There was no varus, valgus, posterior, or posterolateral corner instability.        DESCRIPTION OF OPERATION:  The patient was brought back to the  operating room.  After adequate successful induction of anesthesia, a high right proximal thigh tourniquet was applied, and the lower extremity was prepped with chloroprep. The leg was draped in sterile fashion.       DESCRIPTION OF PROCEDURE:  The patient was brought to the operating room and after successful induction of anesthesia, was prepped and draped in the usual sterile fashion.  All bony prominences were well padded.  A well padded tourniquette was applied. Examination under anesthesia revealed the above-noted findings.     The leg was elevated, Esmarch bandage applied, tourniquet was inflated to 250  mmHg. A 15-cm incision was made through skin and subcutaneous tissues at the prior incision site of his bone patella tendon-bone ACL this is proceeded proximally about 10 centimeters and distally about 2 centimeters.  Incision carried down through skin and subcutaneous tissues.  Meticulous hemostasis obtained with electrocautery.  The infrapatellar branch of the saphenous nerve was identified and protected for the entire duration of this procedure.     Layer one was identified and split in line with the tendons.  The semitendinosis and gracilis tendon were identified underneath the sartorial fascia.  These were dissected from that and divided from their insertion on the tibia.  All interconnections between them and the medial gastroc were divided and they were harvested in the usual fashion with a small soft tissue harvester.  The grafts were prepared on the back table, removing all muscular attachments and pretensioned with whipstitches using # FiberWire on each end. They were kept in vancomycin so warm saline, attention was directed towards the patella tendon.     We debrided the medial lateral retinacular tears and then proceed the patella tendon it was debrided at both ends slightly.  Based on what we had left there is approximately 3 centimeters of distal patella tendon attached to the patella and  about 3 centimeters of patella tendon attached to tibia tubercle.  The patella tendon itself was not healthy I was able proximally to place whipstitches in a Krackow fashion with 5. FiberWire through the patellar tendon medially and laterally to grab the patella tendon firmly enough to uses reapproximation later.  Distally I did place 2 Krackow stitches with 5. FiberWire but the tendon itself was not as healthy.    We block the fluoroscopy in to evaluate the contralateral patella to evaluate patella height I plan to make this with slight more tension likely to loosen over time.    At this point we chose to drill 3 holes through the patella from inferior to superior using a Beath pin and we able to pull the inferior patella tendon sutures through these 3 drill holes through the patella.  This aid in reapproximation of the tibial tubercle portion of the patellar tendon.  Next we weaved both semitendinosus and gracilis tendons through the distal residual patella tendon attached the distal pole of the patella we then sewed these to the medial and lateral aspect of the patella tendon that was left creating a fixed construct proximally with 2 limbs medial and 2 limbs laterally.  Next we proceeded distally at the tibial tubercle we proceeded slightly more lateral in the tibial tunnel for the ACL and drilled a tunnel through and through the tibia after sizing the graft to a size 7. We then passed a suture shuttle through this tunnel we shuttled 1 strand of the hamstring autograft tendon from medial to lateral and the other strand of the hamstring from lateral to medial .  This created 2 autograft limbs of patella tendon we cycled the knee and visualized under fluoroscopy to get adequate tension once adequate tension was obtained through the most medial drill tunnel we pulled the suture ends of 1 graft through the bio screw that was 5.5 placed tension on the graft at 60 degrees of flexion and compressed the graft into this  tunnel with a 5.5 bio anchor, we over sewed the the sutures medial strand to lateral strand onto each other pre a medial and lateral fixed tendon just medial and just lateral to the tibial tubercle.    Next with the proximal tendon stump we passed the sutures through the distal tendon stump and the graft sewing the patella tendon stump off the patella to the graft and to the residual distal patella tendon.  We proceeded to do this medially and laterally with figure-of-eight stitches showing all the autograft to both proximal and distal tendon stumps.    The tourniquet was then deflated and coagulation was obtained of all vessels.  I then cycled the knee easily able to cycle to 90 degrees before tension persisted at the site of repair.    We then repaired the medial and the lateral retinacular tears with a 1. Vicryl.    We irrigated the wound with vancomycin saline.  A pulse lavage with 3000 cc of saline.  All bleeding vessels  were coagulated.  I closed the rent we made in the sartorius fascia with a 0 Vicryl.    The closed from deep to superficial with a 0 Vicryl, 2-0 Vicryl, and Monocryl.      DISPOSITION:  The patient was transferred to the pacu in stable condition.  The patient will initiate a patella tendon repair with hamstring autograft rehabilitation protocol weight-bearing as tolerated with knee locked in extension after 2 weeks unlock the brace and allow passive motion to 90 degrees based off tension during the repair.    I will send him home on antibiotics, narcotic pain medicine, aspirin for DVT prophylaxis.  DISABILTY: The patient will be maintained on temporary partial disability for an interval of approximately 4-6 months.    PROGNOSIS: The patient has a guarded prognosis for anatomic knee range of motion but should be minimal pain with good function quadriceps.  Julianne Handler, DO  07/04/2020, 15:06

## 2020-07-04 NOTE — Anesthesia Postprocedure Evaluation (Signed)
Anesthesia Post Op Evaluation    Patient: Jerome Fitzgerald  Procedure(s):  RIGHT OPEN PATELLAR TENDON REPAIR  WITH HAMSTRING AUTOGRAFT AUGMENTATION    Last Vitals:Temperature: 36.6 C (97.9 F) (07/04/20 1242)  Heart Rate: 84 (07/04/20 1325)  BP (Non-Invasive): (!) 170/94 (07/04/20 1325)  Respiratory Rate: 17 (07/04/20 1325)  SpO2: 97 % (07/04/20 1325)    No complications documented.    Patient is sufficiently recovered from the effects of anesthesia to participate in the evaluation and has returned to their pre-procedure level.  Patient location during evaluation: PACU       Patient participation: complete - patient participated  Level of consciousness: awake and alert and responsive to verbal stimuli    Pain management: adequate  Airway patency: patent    Anesthetic complications: no  Cardiovascular status: acceptable  Respiratory status: acceptable  Hydration status: acceptable  Patient post-procedure temperature: Pt Normothermic   PONV Status: Absent

## 2020-07-04 NOTE — Anesthesia Procedure Notes (Signed)
Block: Peripheral Block    Performed By:   Macario Carls Provider:  Lauraine Rinne, DO  Performing Provider:  Lauraine Rinne, DO     Sedation        Blocks  Laterality: Right Adductor femoral by anesthesia At Bedside  Type of Block: single shot  Ultrasound used for needle placement, imaging, supervision, and interpretation  Image:  images available in PACS  Diagnosis: knee pain   Indication: Acute post operative pain   Pt location: at Bedside    Site verified, H&P updated and consent obtained, Patient monitors applied, Emergency drugs and equipment available, Patient positioned and anesthesia consent given  Technique(See MAR for doses)            Sterile Skin Prep : cap, Mask, Aseptic technique, Draped and Sterile gloves    Skin prepped with: Chlorhexidine gluconate    Skin Local   lidocaine 1%   Needle  Needle type: ultrasound needle     Needle Gauge: 22G   Needle length: 4 in.  Needle localization: ultrasound guidance  Number of attempts: 1      Catheter          Site      Medications  Medications:  Bupivacaine PF (MARCAINE) 0.5% injection, 30 mL  Assessment  Injection assessment: incremental injection, local visualized surrounding nerve on ultrasound and negative aspiration for heme  Paresthesia pain: none        Events     Patient tolerance of procedure: tolerated well, no immediate complications

## 2020-07-04 NOTE — OR PostOp (Signed)
mother understands idscharge instructions patient wishes to be discharged

## 2020-07-17 ENCOUNTER — Ambulatory Visit: Payer: 59 | Attending: Medical | Admitting: Medical

## 2020-07-17 ENCOUNTER — Encounter (HOSPITAL_COMMUNITY): Payer: Self-pay | Admitting: Medical

## 2020-07-17 ENCOUNTER — Other Ambulatory Visit: Payer: Self-pay

## 2020-07-17 DIAGNOSIS — Z9889 Other specified postprocedural states: Secondary | ICD-10-CM

## 2020-07-17 DIAGNOSIS — S86811D Strain of other muscle(s) and tendon(s) at lower leg level, right leg, subsequent encounter: Secondary | ICD-10-CM

## 2020-07-17 DIAGNOSIS — Z09 Encounter for follow-up examination after completed treatment for conditions other than malignant neoplasm: Secondary | ICD-10-CM

## 2020-07-17 NOTE — Progress Notes (Signed)
ORTHOPEDICS   [facility]  [address]  Jerome Fitzgerald [place] [address]        Date of birth: [birth year]  Jerome Fitzgerald 24 y.o. male  BMI: There is no height or weight on file to calculate BMI.    Reason for Visit:    Chief Complaint   Patient presents with    Post Op     2 week post op for right patella tendon repair done 07/04/2020     PCP/Requesting MD: Nelia Shi, MD      SUBJECTIVE  History of Present Illness:  Jerome Fitzgerald is a 24 y.o. male who is 2 weeks status post open right knee patella tendon reconstruction using hamstring autograft augmentation.  Doing well at this point with no significant pain, has been using his crutches as well as keeping his brace locked in extension.  Has not started any physical therapy as of yet.  Denies any warmth, drainage or any evidence of infection of his incision.  Denies any fevers, chills, sweats        Nursing Notes:   Almon Hercules, [place]  07/17/20 1058  Signed  Patient is here for 2 week post op for right patella tendon repair done 07/04/2020.  He is wearing his brace and using the crutches.  He has minimal discomfort today.      There are no problems to display for this patient.    History reviewed. No pertinent past medical history.  Past Surgical History:   Procedure Laterality Date    FIBULA FRACTURE SURGERY Left     HX ACL RECONSTRUCTION Right     2014    HX SHOULDER ARTHROSCOPY Left     2016    PATELLAR TENDON REPAIR Right 07/04/2020    Dr. Stacey Drain     Current Outpatient Medications   Medication Sig    aspirin 81 mg Oral Tablet, Chewable Take 1 Tablet (81 mg total) by mouth Once a day for 14 days    CONTRAVE 8-90 mg Oral Tablet Sustained Release 1 Tablet Once a day       No Known Allergies  Family Medical History:     Problem Relation (Age of Onset)    Hypertension (High Blood Pressure) Mother, Father             Social History     Socioeconomic History    Marital status: Single     Spouse name: Not on file    Number of children:  Not on file    Years of education: Not on file    Highest education level: Not on file   Tobacco Use    Smoking status: Never Smoker    Smokeless tobacco: Never Used   Substance and Sexual Activity    Alcohol use: Yes     Comment: occ    Drug use: Never   Other Topics Concern    Ability to Walk 1 Flight of Steps without SOB/CP Yes    Ability to Walk 2 Flight of Steps without SOB/CP Yes    Ability To Do Own ADL's Yes     Social Determinants of Health     Financial Resource Strain:     Difficulty of Paying Living Expenses:    Food Insecurity:     Worried About Programme researcher, broadcasting/film/video in the Last Year:     Ran Out of Food in the Last Year:    Transportation Needs:     Lack of  Transportation (Medical):     Lack of Transportation (Non-Medical):    Physical Activity:     Days of Exercise per Week:     Minutes of Exercise per Session:    Stress:     Feeling of Stress :    Intimate Partner Violence:     Fear of Current or Ex-Partner:     Emotionally Abused:     Physically Abused:     Sexually Abused:          OBJECTIVE  There were no vitals taken for this visit.         EXAM:  Constitutional  General appearance comfortable, nutritional status does not appear malnourished,     Psychiatric  Does not appear depressed or anxious currently,  alert and oriented    Skin  Normal color no lesions noted, no grossly elevated temperature or significant texture changes    Lymph  No significant lymphadenopathy noted in the specific muscle groups and compartments tested today.    HEENT  Normocephalic atraumatic, extraocular muscles intact, anicteric, no gross lid ptosis, trachea midline    Neck/Neuro  No asymmetry no significant decrease or limitation of motion,     Respiratory  No use of accessory muscles for respiration no gross audible wheezing or labored breathing    Cardiovascular  Pulses regular 2+ radial or dorsalis pulses bilaterally, no palpable thrills    Musculoskeletal examination    Inspection-surgical  incision is healing well, there is no evidence of erythema, warmth, or infection.  No effusion appreciated    Palpation-no tenderness around the incision site, medial, lateral, or posterior knee    Neuro-no neurovascular deficits, distal pulses intact.  No calf tenderness, negative Homans.    ROM-0-90 without any significant pain    STABILITY-no anterior, posterior, varus, valgus laxity appreciated on exam      Assessment:    ICD-10-CM    1. Status post tendon repair  Z98.890 PT - External   2. Rupture of right patellar tendon, subsequent encounter  S86.811D PT - External   3. Postoperative examination  Z09      Whitman was seen today for post op.    Diagnoses and all orders for this visit:    Status post tendon repair  -     PT - External; Future    Rupture of right patellar tendon, subsequent encounter  -     PT - External; Future    Postoperative examination           Plan:  We will get him started on physical therapy to work on gentle range of motion, we will allow him to unlock his brace at rest to 90 of flexion.  We will have him continue to keep it locked with ambulation, also have him continue his crutches as well.  We will follow-up again with him in about 4 weeks, if there is any new questions or concerns we did encourage him to give [place] a call.  All questions were answered.      Lavonna Monarch, PA-C    Portions of this note may be dictated using voice recognition software or a dictation service. Variance in spelling and vocabulary are possible and unintentional. Not all errors are caught/corrected. Please notify the Thereasa Parkin if any discrepancies are noted or if the meaning of a statement is not clear.

## 2020-07-17 NOTE — Nursing Note (Signed)
Patient is here for 2 week post op for right patella tendon repair done 07/04/2020.  He is wearing his brace and using the crutches.  He has minimal discomfort today.

## 2020-08-14 ENCOUNTER — Encounter (HOSPITAL_COMMUNITY): Payer: Self-pay | Admitting: Medical

## 2020-08-14 ENCOUNTER — Ambulatory Visit: Payer: 59 | Attending: Medical | Admitting: Medical

## 2020-08-14 ENCOUNTER — Other Ambulatory Visit: Payer: Self-pay

## 2020-08-14 VITALS — Ht 71.0 in | Wt 270.0 lb

## 2020-08-14 DIAGNOSIS — S86811D Strain of other muscle(s) and tendon(s) at lower leg level, right leg, subsequent encounter: Secondary | ICD-10-CM

## 2020-08-14 DIAGNOSIS — Z09 Encounter for follow-up examination after completed treatment for conditions other than malignant neoplasm: Secondary | ICD-10-CM

## 2020-08-14 DIAGNOSIS — Z9889 Other specified postprocedural states: Secondary | ICD-10-CM

## 2020-08-14 NOTE — Progress Notes (Signed)
ORTHOPEDICS   [facility]  [address]  [name] [address]        Date of birth: 06/20/1996  Jerome Fitzgerald 24 y.o. male  BMI: Body mass index is 37.66 kg/m.    Reason for Visit:    Chief Complaint   Patient presents with    Post Op     6 week post op for right patella tendon repair done 07/04/2020     PCP/Requesting MD: Nelia Shi, MD      SUBJECTIVE  History of Present Illness:  Jerome Fitzgerald is a 24 y.o. male who is status post right patellar tendon repair using autograft augmentation done on 07/04/2020.  Doing very well this point with only some minor soreness and stiffness, continues to work with his physical therapist is doing well.  Continues to use his brace and his crutches.  Denies any new injury trauma, any other acute complaints today.        Nursing Notes:   Almon Hercules, [place]  08/14/20 3419  Signed  Patient is here for 6 week post op for right patella tendon repair done 07/04/2020.  He participates in physical therapy twice per week.  He is wearing his brace and using his crutches, but bearing weight.  There are no complaints of pain today.      There are no problems to display for this patient.    History reviewed. No pertinent past medical history.  Past Surgical History:   Procedure Laterality Date    FIBULA FRACTURE SURGERY Left     HX ACL RECONSTRUCTION Right     2014    HX SHOULDER ARTHROSCOPY Left     2016    PATELLAR TENDON REPAIR Right 07/04/2020    Dr. Stacey Drain     Current Outpatient Medications   Medication Sig    CONTRAVE 8-90 mg Oral Tablet Sustained Release 1 Tablet Once a day       No Known Allergies  Family Medical History:     Problem Relation (Age of Onset)    Hypertension (High Blood Pressure) Mother, Father           Social History     Socioeconomic History    Marital status: Single     Spouse name: Not on file    Number of children: Not on file    Years of education: Not on file    Highest education level: Not on file   Tobacco Use     Smoking status: Never Smoker    Smokeless tobacco: Never Used   Substance and Sexual Activity    Alcohol use: Yes     Comment: occ    Drug use: Never   Other Topics Concern    Ability to Walk 1 Flight of Steps without SOB/CP Yes    Ability to Walk 2 Flight of Steps without SOB/CP Yes    Ability To Do Own ADL's Yes         OBJECTIVE  Ht 1.803 m (5\' 11" )    Wt 122 kg (270 lb)    BMI 37.66 kg/m          EXAM:  Constitutional  General appearance comfortable, nutritional status does not appear malnourished,     Psychiatric  Does not appear depressed or anxious currently,  alert and oriented    Skin  Normal color no lesions noted, no grossly elevated temperature or significant texture changes    Lymph  No significant lymphadenopathy  noted in the specific muscle groups and compartments tested today.    HEENT  Normocephalic atraumatic, extraocular muscles intact, anicteric, no gross lid ptosis, trachea midline    Neck/Neuro  No asymmetry no significant decrease or limitation of motion,     Respiratory  No use of accessory muscles for respiration no gross audible wheezing or labored breathing    Cardiovascular  Pulses regular 2+ radial or dorsalis pulses bilaterally, no palpable thrills    Musculoskeletal examination    Inspection-surgical incision is well healed, there is no evidence of erythema warmth trauma or infection.  No effusion noted    Palpation-no tenderness along the incision site, medial, lateral, or posterior knee    Neuro-no neurovascular deficits, no calf tenderness, negative Homans strength equal to the contralateral lower extremity    ROM-can achieve terminal extension, can easily flex to 100 and beyond without any significant anterior based discomfort    STABILITY-no anterior, posterior, varus, valgus laxity appreciated          Assessment:    ICD-10-CM    1. Status post tendon repair  Z98.890    2. Rupture of right patellar tendon, subsequent encounter  S86.811D    3. Postoperative examination   Z09      Eydan was seen today for post op.    Diagnoses and all orders for this visit:    Status post tendon repair    Rupture of right patellar tendon, subsequent encounter    Postoperative examination           Plan:  We will allow him to discontinue use of his brace at this point, we will have him continue to crutches x1 week, then transition him to 1 crutch, then after 1 week transition him to weight-bearing as tolerated with no assistance.  We will also have him continue with physical therapy, and follow-up again with him in about 6 weeks.  If he has any new questions concerns we did encourage him to give [place] a call.  All questions were answered.      Jerome Monarch, PA-C    Portions of this note may be dictated using voice recognition software or a dictation service. Variance in spelling and vocabulary are possible and unintentional. Not all errors are caught/corrected. Please notify the Thereasa Parkin if any discrepancies are noted or if the meaning of a statement is not clear.

## 2020-08-14 NOTE — Nursing Note (Signed)
Patient is here for 6 week post op for right patella tendon repair done 07/04/2020.  He participates in physical therapy twice per week.  He is wearing his brace and using his crutches, but bearing weight.  There are no complaints of pain today.

## 2020-10-04 ENCOUNTER — Other Ambulatory Visit: Payer: Self-pay

## 2020-10-04 ENCOUNTER — Ambulatory Visit: Payer: 59 | Attending: Orthopaedic Surgery | Admitting: Orthopaedic Surgery

## 2020-10-04 ENCOUNTER — Encounter (HOSPITAL_COMMUNITY): Payer: Self-pay | Admitting: Orthopaedic Surgery

## 2020-10-04 VITALS — Ht 71.0 in | Wt 270.0 lb

## 2020-10-04 DIAGNOSIS — Z09 Encounter for follow-up examination after completed treatment for conditions other than malignant neoplasm: Secondary | ICD-10-CM

## 2020-10-04 DIAGNOSIS — S86811D Strain of other muscle(s) and tendon(s) at lower leg level, right leg, subsequent encounter: Secondary | ICD-10-CM

## 2020-10-04 DIAGNOSIS — Z9889 Other specified postprocedural states: Secondary | ICD-10-CM

## 2020-10-04 NOTE — Progress Notes (Signed)
Dr. Veverly Fells. Katherene Ponto SPORTS MEDICINE FELLOW   [facility]  [address]  [name] [address]        Date of birth: Nov 07, 1995  Autumn Gunn 25 y.o. male  BMI: Body mass index is 37.66 kg/m.    Reason for Visit:    Chief Complaint   Patient presents with   . Post Op     3 month follow up for right patella tendon repair done 07/04/2020     PCP/Requesting MD: Nelia Shi, MD      SUBJECTIVE  History of Present Illness:  Arlester Keehan is a 25 y.o. male had a pretty significant injury where he ruptured his patellar tendon after an ACL reconstruction we uses hamstring as an autograft to repair the patellar tendon and augment the patella tendon repair itself.  He is 3 months out now he is really not having any complaints of pain he still has some quad atrophy but he is now walking without a knee immobilizer and a normal knee gait pattern.    Denies any pain.  States he has some mild numbness along the incision but no new concerns.        Nursing Notes:   Almon Hercules, [place]  10/04/20 0930  Signed  Patient is here for 3 month follow up for right patella tendon repair done 07/04/2020.  He has no complaints of pain.  He continues to exercise at home.      There are no problems to display for this patient.    History reviewed. No pertinent past medical history.  Past Surgical History:   Procedure Laterality Date   . FIBULA FRACTURE SURGERY Left    . HX ACL RECONSTRUCTION Right     2014   . HX SHOULDER ARTHROSCOPY Left     2016   . PATELLAR TENDON REPAIR Right 07/04/2020    Dr. Stacey Drain     Current Outpatient Medications   Medication Sig   . CONTRAVE 8-90 mg Oral Tablet Sustained Release 1 Tablet Once a day       No Known Allergies  Family Medical History:     Problem Relation (Age of Onset)    Hypertension (High Blood Pressure) Mother, Father           Social History     Socioeconomic History   . Marital status: Single     Spouse name: Not on file   . Number of children: Not on  file   . Years of education: Not on file   . Highest education level: Not on file   Tobacco Use   . Smoking status: Never Smoker   . Smokeless tobacco: Never Used   Substance and Sexual Activity   . Alcohol use: Yes     Comment: occ   . Drug use: Never   Other Topics Concern   . Ability to Walk 1 Flight of Steps without SOB/CP Yes   . Ability to Walk 2 Flight of Steps without SOB/CP Yes   . Ability To Do Own ADL's Yes         ROS: Review of Systems     Review of Systems:  Musculoskeletal: within normal limits,   Negative for neck pain and neck stiffness and low back pain.  Review of Systems     Constitutional: Positive for activity change. Negative for chills and fever.   HEENT: Negative.    Eyes: Negative.    Respiratory:  Negative for shortness of breath.    Cardiovascular: Negative.    Gastrointestinal: Negative for abdominal pain.   Endocrine: Negative.    Genitourinary: Negative for flank pain.   Skin: Negative.    Allergic/Immunologic: Negative.    Neurological: Positive for  occasional weakness. Negative for tremors and numbness.   Hematological: Negative.    Psychiatric/Behavioral: Negative.    All other systems reviewed had no pertinent positives.      All other systems reviewed had no pertinent positives.  OBJECTIVE  Ht 1.803 m (5\' 11" )   Wt 122 kg (270 lb)   BMI 37.66 kg/m           @VITALS @     Radiology:  No orders to display      EXAM:  Constitutional  General appearance comfortable, nutritional status does not appear malnourished,     Psychiatric  Does not appear depressed or anxious currently,  alert and oriented    Skin  Normal color no lesions noted, no grossly elevated temperature or significant texture changes    Lymph  No significant lymphadenopathy noted in the specific muscle groups and compartments tested today.    HEENT  Normocephalic atraumatic, extraocular muscles intact, anicteric, no gross lid ptosis, trachea midline    Neck/Neuro  No asymmetry no significant decrease or limitation of  motion,     Respiratory  No use of accessory muscles for respiration no gross audible wheezing or labored breathing    Cardiovascular  Pulses regular 2+ radial or dorsalis pulses bilaterally, no palpable thrills    Musculoskeletal examination    Inspection small little abrasion off the incision anteriorly he says he bumped it on a steering wheel no sign of infection    Palpation no pain with palpation some mild numbness along the lateral aspect of the incision where the infrapatellar branch of the saphenous nerves located    Neuro EHL gastrocsoleus all grossly intact quad hamstrings are grossly intact    ROM patient has no extensor lag stable flex easily to 130 on the right    STABILITY no varus valgus instability    SPECIAL TESTS negative Homans        Assessment:    ICD-10-CM    1. Status post tendon repair  Z98.890    2. Postoperative examination  Z09    3. Rupture of right patellar tendon, subsequent encounter  S86.811D      Jaevion was seen today for post op.    Diagnoses and all orders for this visit:    Status post tendon repair    Postoperative examination    Rupture of right patellar tendon, subsequent encounter           Plan:  Doing excellent status post a hamstring autograft patella tendon repair.  Next face is building strength of his quad with physical therapy.  Recommend he not do squats or lunges yet or jumping.  Just build strength using bike elliptical walk and leg press.  Follow-up 8-12 weeks re-evaluate symptoms.    , DO    Portions of this note may be dictated using voice recognition software or a dictation service. Variance in spelling and vocabulary are possible and unintentional. Not all errors are caught/corrected. Please notify the 02-16-1986 if any discrepancies are noted or if the meaning of a statement is not clear.

## 2020-10-04 NOTE — Nursing Note (Signed)
Patient is here for 3 month follow up for right patella tendon repair done 07/04/2020.  He has no complaints of pain.  He continues to exercise at home.

## 2020-12-11 ENCOUNTER — Ambulatory Visit: Payer: 59 | Attending: Orthopaedic Surgery | Admitting: Orthopaedic Surgery

## 2020-12-11 ENCOUNTER — Encounter (HOSPITAL_COMMUNITY): Payer: Self-pay | Admitting: Orthopaedic Surgery

## 2020-12-11 ENCOUNTER — Other Ambulatory Visit: Payer: Self-pay

## 2020-12-11 VITALS — Ht 71.0 in | Wt 270.0 lb

## 2020-12-11 DIAGNOSIS — Z09 Encounter for follow-up examination after completed treatment for conditions other than malignant neoplasm: Secondary | ICD-10-CM

## 2020-12-11 DIAGNOSIS — S86811D Strain of other muscle(s) and tendon(s) at lower leg level, right leg, subsequent encounter: Secondary | ICD-10-CM

## 2020-12-11 DIAGNOSIS — Z9889 Other specified postprocedural states: Secondary | ICD-10-CM

## 2020-12-11 NOTE — Progress Notes (Signed)
Dr. Veverly Fitzgerald. Jerome Fitzgerald SPORTS MEDICINE FELLOW   [facility]  [address]  [place] [address]        Date of birth: March 19, 1996  Jerome Fitzgerald 25 y.o. male  BMI: Body mass index is 37.66 kg/m.    Reason for Visit:    Chief Complaint   Patient presents with    Post Op     follow up for right patellar tendon repair done 07/04/2020     PCP/Requesting MD: Jerome Shi, MD      SUBJECTIVE  History of Present Illness:  Jerome Fitzgerald is a 25 y.o. male really has no complaints about his right knee.  States he is doing his exercises at home he has not really stressed too awful much in the gym he has been doing the elliptical machine he has been walking but not really a lot of strength training exercises.    He is 5 months out did a right patella tendon autograft reconstruction for a patella tendon tear status post bone patella tendon-bone ACL.  I did a hamstring autograft reconstruction through bone tunnels of the patella and tibial tubercle.        Nursing Notes:   Jerome Fitzgerald, [place]  12/11/20 1420  Signed  Patient is here for follow up for right patellar tendon repair done 07/04/2020.  He has no complaints of pain.  He does continue to do his exercises at home.      There are no problems to display for this patient.    No past medical history on file.  Past Surgical History:   Procedure Laterality Date    FIBULA FRACTURE SURGERY Left     HX ACL RECONSTRUCTION Right     2014    HX SHOULDER ARTHROSCOPY Left     2016    PATELLAR TENDON REPAIR Right 07/04/2020    Dr. Stacey Fitzgerald     Current Outpatient Medications   Medication Sig    CONTRAVE 8-90 mg Oral Tablet Sustained Release 1 Tablet Once a day       No Known Allergies  Family Medical History:     Problem Relation (Age of Onset)    Hypertension (High Blood Pressure) Mother, Father           Social History     Socioeconomic History    Marital status: Single   Tobacco Use    Smoking status: Never Smoker    Smokeless tobacco:  Never Used   Substance and Sexual Activity    Alcohol use: Yes     Comment: occ    Drug use: Never   Other Topics Concern    Ability to Walk 1 Flight of Steps without SOB/CP Yes    Ability to Walk 2 Flight of Steps without SOB/CP Yes    Ability To Do Own ADL's Yes         ROS: Review of Systems     Review of Systems:  Musculoskeletal: within normal limits,  Review of Systems     Constitutional: Positive for activity change. Negative for chills and fever.   HEENT: Negative.    Eyes: Negative.    Respiratory: Negative for shortness of breath.    Cardiovascular: Negative.    Gastrointestinal: Negative for abdominal pain.   Endocrine: Negative.    Genitourinary: Negative for flank pain.   Skin: Negative.    Allergic/Immunologic: Negative.    Neurological: Positive for  occasional weakness. Negative for tremors  and numbness.   Hematological: Negative.    Psychiatric/Behavioral: Negative.    All other systems reviewed had no pertinent positives.      All other systems reviewed had no pertinent positives.  OBJECTIVE  Ht 1.803 m (5\' 11" )    Wt 122 kg (270 lb)    BMI 37.66 kg/m           @VITALS @     Radiology:  No orders to display      EXAM:  Constitutional  General appearance comfortable, nutritional status does not appear malnourished,     Psychiatric  Does not appear depressed or anxious currently,  alert and oriented    Skin  Normal color no lesions noted, no grossly elevated temperature or significant texture changes    Lymph  No significant lymphadenopathy noted in the specific muscle groups and compartments tested today.    HEENT  Normocephalic atraumatic, extraocular muscles intact, anicteric, no gross lid ptosis, trachea midline    Neck/Neuro  No asymmetry no significant decrease or limitation of motion,     Respiratory  No use of accessory muscles for respiration no gross audible wheezing or labored breathing    Cardiovascular  Pulses regular 2+ radial or dorsalis pulses bilaterally, no palpable  thrills    Musculoskeletal examination    Inspection incision is benign no gross infection no erythema no gross edema    Palpation no pain with palpation tibial tubercle no pain with palpation inferior pole patella    Neuro neurologically intact right lower extremity EHL gastrocsoleus grossly intact he is able do a straight leg raise on the right lower extremity    ROM patient is about -3 of extension compared the left flexion is pretty easily 125 on the right    STABILITY there is no varus valgus instability his quad strength is about 4+ on the right compared 5 5 left    SPECIAL TESTS subtle extensor lag on the right compared the left, patient patella height seems to be normalized compared to his contralateral extremity        Assessment:    ICD-10-CM    1. Status post tendon repair  Z98.890    2. Postoperative examination  Z09    3. Rupture of right patellar tendon, subsequent encounter  S86.811D      Jerome Fitzgerald was seen today for post op.    Diagnoses and all orders for this visit:    Status post tendon repair    Postoperative examination    Rupture of right patellar tendon, subsequent encounter           Plan:  Doing well status post an autograft reconstruction patellar tendon.  Were going to start progressing his strength training for his quads over the next 3-4 months.  Will re-evaluate 3 4 months his strength and then discuss return to normal routine.  I recommend close change activities foot planted on the ground doing squats wall squats leg press.  No jumping sports.      , DO    Portions of this note may be dictated using voice recognition software or a dictation service. Variance in spelling and vocabulary are possible and unintentional. Not all errors are caught/corrected. Please notify the 02-16-1986 if any discrepancies are noted or if the meaning of a statement is not clear.

## 2020-12-11 NOTE — Nursing Note (Signed)
Patient is here for follow up for right patellar tendon repair done 07/04/2020.  He has no complaints of pain.  He does continue to do his exercises at home.

## 2021-04-11 ENCOUNTER — Encounter (HOSPITAL_COMMUNITY): Payer: Self-pay | Admitting: Orthopaedic Surgery

## 2021-05-02 ENCOUNTER — Other Ambulatory Visit: Payer: Self-pay

## 2021-05-02 ENCOUNTER — Ambulatory Visit (INDEPENDENT_AMBULATORY_CARE_PROVIDER_SITE_OTHER): Payer: 59

## 2021-05-02 ENCOUNTER — Other Ambulatory Visit: Payer: 59 | Attending: Family | Admitting: Family

## 2021-05-02 DIAGNOSIS — Z6841 Body Mass Index (BMI) 40.0 and over, adult: Secondary | ICD-10-CM

## 2021-05-02 DIAGNOSIS — I10 Essential (primary) hypertension: Secondary | ICD-10-CM

## 2021-05-02 LAB — COMPREHENSIVE METABOLIC PANEL, NON-FASTING
ALBUMIN/GLOBULIN RATIO: 1.4 — ABNORMAL LOW (ref 1.5–2.5)
ALBUMIN: 4.5 g/dL (ref 3.5–5.0)
ALKALINE PHOSPHATASE: 71 U/L (ref 38–126)
ALT (SGPT): 32 U/L (ref <50)
ANION GAP: 11 mmol/L (ref 5–19)
AST (SGOT): 37 U/L (ref 17–59)
BILIRUBIN TOTAL: 0.6 mg/dL (ref 0.2–1.3)
BUN/CREA RATIO: 13 (ref 6–20)
BUN: 11 mg/dL (ref 9–20)
CALCIUM: 9.6 mg/dL (ref 8.4–10.2)
CHLORIDE: 111 mmol/L — ABNORMAL HIGH (ref 98–107)
CO2 TOTAL: 23 mmol/L (ref 22–30)
CREATININE: 0.83 mg/dL (ref 0.66–1.20)
ESTIMATED GFR: >60 mL/min/{1.73_m2} (ref >60)
GLUCOSE: 90 mg/dL (ref 74–106)
POTASSIUM: 4.2 mmol/L (ref 3.5–5.1)
PROTEIN TOTAL: 7.8 g/dL (ref 6.3–8.2)
SODIUM: 145 mmol/L (ref 137–145)

## 2021-05-02 LAB — CBC WITH DIFF
BASOPHIL #: 0.1 10*3/uL (ref 0.00–0.20)
BASOPHIL %: 1 % (ref 0–2)
EOSINOPHIL #: 0.1 10*3/uL (ref 0.00–0.60)
EOSINOPHIL %: 1 % (ref 0–5)
HCT: 43 % (ref 36.0–46.0)
HGB: 14.5 g/dL (ref 13.9–16.3)
LYMPHOCYTE #: 3 10*3/uL (ref 1.10–3.80)
LYMPHOCYTE %: 30 % (ref 19–46)
MCH: 28.2 pg (ref 25.4–34.0)
MCHC: 33.7 g/dL (ref 30.0–37.0)
MCV: 83.6 fL (ref 80.0–100.0)
MONOCYTE #: 0.5 10*3/uL (ref 0.10–0.80)
MONOCYTE %: 5 % (ref 4–12)
MPV: 9 fL (ref 7.5–11.5)
NEUTROPHIL #: 6.2 10*3/uL (ref 1.80–7.50)
NEUTROPHIL %: 63 % (ref 41–69)
PLATELETS: 260 10*3/uL (ref 130–400)
RBC: 5.14 10*6/uL (ref 4.30–5.90)
RDW: 13.9 % (ref 11.5–14.0)
WBC: 9.8 10*3/uL (ref 4.5–11.5)

## 2021-05-02 LAB — LIPID PANEL
CHOLESTEROL: 233 mg/dL — ABNORMAL HIGH (ref 0–200)
HDL CHOL: 41 mg/dL (ref >40)
LDL CALC: 169 mg/dL — ABNORMAL HIGH (ref <100)
TRIGLYCERIDES: 115 mg/dL (ref <150)

## 2021-05-02 LAB — THYROID STIMULATING HORMONE WITH FREE T4 REFLEX: TSH: 2.44 u[IU]/mL (ref 0.465–4.680)

## 2021-08-21 ENCOUNTER — Other Ambulatory Visit (HOSPITAL_COMMUNITY): Payer: Self-pay | Admitting: Family Medicine

## 2021-08-21 DIAGNOSIS — I10 Essential (primary) hypertension: Secondary | ICD-10-CM

## 2021-08-27 ENCOUNTER — Other Ambulatory Visit: Payer: 59 | Attending: Family Medicine

## 2021-08-27 ENCOUNTER — Other Ambulatory Visit: Payer: Self-pay

## 2021-08-27 DIAGNOSIS — I10 Essential (primary) hypertension: Secondary | ICD-10-CM | POA: Insufficient documentation

## 2021-08-27 LAB — CBC WITH DIFF
BASOPHIL #: 0.1 10*3/uL (ref 0.00–0.20)
BASOPHIL %: 1 % (ref 0–2)
EOSINOPHIL #: 0.1 10*3/uL (ref 0.00–0.60)
EOSINOPHIL %: 1 % (ref 0–5)
HCT: 44.3 % (ref 36.0–46.0)
HGB: 14.7 g/dL (ref 13.9–16.3)
LYMPHOCYTE #: 3.2 10*3/uL (ref 1.10–3.80)
LYMPHOCYTE %: 31 % (ref 19–46)
MCH: 27.6 pg (ref 25.4–34.0)
MCHC: 33.2 g/dL (ref 30.0–37.0)
MCV: 83.1 fL (ref 80.0–100.0)
MONOCYTE #: 0.6 10*3/uL (ref 0.10–0.80)
MONOCYTE %: 6 % (ref 4–12)
MPV: 8.8 fL (ref 7.5–11.5)
NEUTROPHIL #: 6.5 10*3/uL (ref 1.80–7.50)
NEUTROPHIL %: 62 % (ref 41–69)
PLATELETS: 265 10*3/uL (ref 130–400)
RBC: 5.33 10*6/uL (ref 4.30–5.90)
RDW: 14 % (ref 11.5–14.0)
WBC: 10.5 10*3/uL (ref 4.5–11.5)

## 2021-08-27 LAB — COMPREHENSIVE METABOLIC PNL, FASTING
ALBUMIN/GLOBULIN RATIO: 1.3 — ABNORMAL LOW (ref 1.5–2.5)
ALBUMIN: 4.3 g/dL (ref 3.5–5.0)
ALKALINE PHOSPHATASE: 60 U/L (ref 38–126)
ALT (SGPT): 28 U/L (ref <50)
ANION GAP: 7 mmol/L (ref 5–19)
AST (SGOT): 28 U/L (ref 17–59)
BILIRUBIN TOTAL: 0.5 mg/dL (ref 0.2–1.3)
BUN/CREA RATIO: 15 (ref 6–20)
BUN: 13 mg/dL (ref 9–20)
CALCIUM: 9.2 mg/dL (ref 8.4–10.2)
CHLORIDE: 110 mmol/L — ABNORMAL HIGH (ref 98–107)
CO2 TOTAL: 24 mmol/L (ref 22–30)
CREATININE: 0.87 mg/dL (ref 0.66–1.20)
ESTIMATED GFR: >60 mL/min/{1.73_m2} (ref >60)
GLUCOSE: 82 mg/dL (ref 74–106)
POTASSIUM: 4.2 mmol/L (ref 3.5–5.1)
PROTEIN TOTAL: 7.6 g/dL (ref 6.3–8.2)
SODIUM: 141 mmol/L (ref 137–145)

## 2021-08-27 LAB — LIPID PANEL
CHOLESTEROL: 224 mg/dL — ABNORMAL HIGH (ref 0–200)
HDL CHOL: 42 mg/dL (ref >40)
LDL CALC: 148 mg/dL — ABNORMAL HIGH (ref <100)
TRIGLYCERIDES: 168 mg/dL — ABNORMAL HIGH (ref <150)

## 2021-08-27 LAB — THYROID STIMULATING HORMONE WITH FREE T4 REFLEX: TSH: 2.27 u[IU]/mL (ref 0.465–4.680)

## 2022-12-03 ENCOUNTER — Other Ambulatory Visit: Payer: Self-pay | Admitting: Family

## 2023-05-02 ENCOUNTER — Other Ambulatory Visit: Payer: Self-pay | Admitting: Family

## 2023-08-14 ENCOUNTER — Other Ambulatory Visit: Payer: Self-pay | Admitting: Family

## 2024-04-12 ENCOUNTER — Other Ambulatory Visit: Payer: Self-pay

## 2024-04-12 MED FILL — tirzepatide 15 mg/0.5 mL subcutaneous pen injector: SUBCUTANEOUS | 56 days supply | Qty: 4 | Fill #0 | Status: AC

## 2024-10-11 ENCOUNTER — Other Ambulatory Visit: Payer: Self-pay

## 2024-10-13 ENCOUNTER — Other Ambulatory Visit: Payer: Self-pay

## 2024-10-16 ENCOUNTER — Other Ambulatory Visit: Payer: Self-pay

## 2024-10-21 ENCOUNTER — Other Ambulatory Visit: Payer: Self-pay

## 2024-10-26 ENCOUNTER — Other Ambulatory Visit: Payer: Self-pay
